# Patient Record
Sex: Female | Born: 1945 | Race: White | Hispanic: No | Marital: Married | Smoking: Former smoker
Health system: Southern US, Community
[De-identification: ages and names within clinical notes are randomized; demographics above are authoritative.]

## PROBLEM LIST (undated history)

## (undated) DIAGNOSIS — F329 Major depressive disorder, single episode, unspecified: Secondary | ICD-10-CM

## (undated) DIAGNOSIS — K219 Gastro-esophageal reflux disease without esophagitis: Secondary | ICD-10-CM

## (undated) DIAGNOSIS — I10 Essential (primary) hypertension: Secondary | ICD-10-CM

## (undated) DIAGNOSIS — N390 Urinary tract infection, site not specified: Secondary | ICD-10-CM

## (undated) DIAGNOSIS — R011 Cardiac murmur, unspecified: Secondary | ICD-10-CM

## (undated) DIAGNOSIS — J40 Bronchitis, not specified as acute or chronic: Secondary | ICD-10-CM

## (undated) DIAGNOSIS — R51 Headache: Secondary | ICD-10-CM

## (undated) DIAGNOSIS — R0602 Shortness of breath: Secondary | ICD-10-CM

## (undated) DIAGNOSIS — K743 Primary biliary cirrhosis: Secondary | ICD-10-CM

## (undated) DIAGNOSIS — K439 Ventral hernia without obstruction or gangrene: Secondary | ICD-10-CM

## (undated) DIAGNOSIS — M1990 Unspecified osteoarthritis, unspecified site: Secondary | ICD-10-CM

## (undated) DIAGNOSIS — R32 Unspecified urinary incontinence: Secondary | ICD-10-CM

## (undated) DIAGNOSIS — C50919 Malignant neoplasm of unspecified site of unspecified female breast: Secondary | ICD-10-CM

## (undated) DIAGNOSIS — F419 Anxiety disorder, unspecified: Secondary | ICD-10-CM

## (undated) DIAGNOSIS — R161 Splenomegaly, not elsewhere classified: Secondary | ICD-10-CM

## (undated) DIAGNOSIS — F32A Depression, unspecified: Secondary | ICD-10-CM

## (undated) DIAGNOSIS — G629 Polyneuropathy, unspecified: Secondary | ICD-10-CM

## (undated) DIAGNOSIS — D649 Anemia, unspecified: Secondary | ICD-10-CM

## (undated) DIAGNOSIS — Z9851 Tubal ligation status: Secondary | ICD-10-CM

## (undated) DIAGNOSIS — K649 Unspecified hemorrhoids: Secondary | ICD-10-CM

## (undated) HISTORY — DX: Polyneuropathy, unspecified: G62.9

## (undated) HISTORY — DX: Anxiety disorder, unspecified: F41.9

## (undated) HISTORY — DX: Tubal ligation status: Z98.51

## (undated) HISTORY — DX: Major depressive disorder, single episode, unspecified: F32.9

## (undated) HISTORY — PX: BREAST SURGERY: SHX581

## (undated) HISTORY — PX: COLONOSCOPY: SHX174

## (undated) HISTORY — PX: ABDOMINAL HYSTERECTOMY: SUR658

## (undated) HISTORY — DX: Malignant neoplasm of unspecified site of unspecified female breast: C50.919

## (undated) HISTORY — PX: EXCISIONAL HEMORRHOIDECTOMY: SHX1541

## (undated) HISTORY — PX: UPPER GASTROINTESTINAL ENDOSCOPY: SHX188

## (undated) HISTORY — PX: ABDOMINAL HYSTERECTOMY: SHX81

## (undated) HISTORY — PX: MASTECTOMY: SHX3

## (undated) HISTORY — DX: Primary biliary cirrhosis: K74.3

## (undated) HISTORY — DX: Depression, unspecified: F32.A

## (undated) HISTORY — PX: TUBAL LIGATION: SHX77

---

## 2003-04-05 ENCOUNTER — Encounter (INDEPENDENT_AMBULATORY_CARE_PROVIDER_SITE_OTHER): Payer: Self-pay | Admitting: *Deleted

## 2003-04-05 ENCOUNTER — Ambulatory Visit (HOSPITAL_COMMUNITY): Admission: RE | Admit: 2003-04-05 | Discharge: 2003-04-05 | Payer: Self-pay | Admitting: Oncology

## 2003-07-02 ENCOUNTER — Ambulatory Visit (HOSPITAL_COMMUNITY): Admission: RE | Admit: 2003-07-02 | Discharge: 2003-07-02 | Payer: Self-pay | Admitting: Internal Medicine

## 2004-05-15 ENCOUNTER — Ambulatory Visit: Payer: Self-pay | Admitting: Internal Medicine

## 2004-05-29 ENCOUNTER — Ambulatory Visit: Payer: Self-pay | Admitting: Internal Medicine

## 2005-01-08 ENCOUNTER — Ambulatory Visit: Payer: Self-pay | Admitting: Internal Medicine

## 2005-08-18 ENCOUNTER — Ambulatory Visit: Payer: Self-pay | Admitting: Internal Medicine

## 2005-08-19 ENCOUNTER — Ambulatory Visit (HOSPITAL_COMMUNITY): Admission: RE | Admit: 2005-08-19 | Discharge: 2005-08-19 | Payer: Self-pay | Admitting: Internal Medicine

## 2006-02-10 ENCOUNTER — Ambulatory Visit: Payer: Self-pay | Admitting: Internal Medicine

## 2006-09-02 ENCOUNTER — Ambulatory Visit: Payer: Self-pay | Admitting: Internal Medicine

## 2009-04-19 ENCOUNTER — Ambulatory Visit (HOSPITAL_COMMUNITY): Admission: RE | Admit: 2009-04-19 | Discharge: 2009-04-19 | Payer: Self-pay | Admitting: Internal Medicine

## 2010-02-11 ENCOUNTER — Ambulatory Visit: Payer: Self-pay | Admitting: Internal Medicine

## 2010-02-12 ENCOUNTER — Inpatient Hospital Stay (HOSPITAL_COMMUNITY): Admission: AD | Admit: 2010-02-12 | Discharge: 2010-02-14 | Payer: Self-pay | Admitting: Internal Medicine

## 2010-02-12 ENCOUNTER — Ambulatory Visit: Payer: Self-pay | Admitting: Internal Medicine

## 2010-02-12 ENCOUNTER — Ambulatory Visit: Payer: Self-pay | Admitting: Cardiology

## 2010-02-14 ENCOUNTER — Encounter (INDEPENDENT_AMBULATORY_CARE_PROVIDER_SITE_OTHER): Payer: Self-pay | Admitting: Internal Medicine

## 2010-02-17 ENCOUNTER — Ambulatory Visit (HOSPITAL_COMMUNITY): Admission: RE | Admit: 2010-02-17 | Payer: Self-pay | Admitting: Internal Medicine

## 2010-03-03 ENCOUNTER — Ambulatory Visit: Payer: Self-pay | Admitting: Internal Medicine

## 2010-03-29 ENCOUNTER — Inpatient Hospital Stay (HOSPITAL_COMMUNITY)
Admission: AD | Admit: 2010-03-29 | Discharge: 2010-03-30 | Payer: Self-pay | Source: Home / Self Care | Attending: Internal Medicine | Admitting: Internal Medicine

## 2010-04-19 ENCOUNTER — Encounter (INDEPENDENT_AMBULATORY_CARE_PROVIDER_SITE_OTHER): Payer: Self-pay | Admitting: Internal Medicine

## 2010-04-21 ENCOUNTER — Ambulatory Visit: Admit: 2010-04-21 | Payer: Self-pay | Admitting: Internal Medicine

## 2010-05-12 ENCOUNTER — Ambulatory Visit (INDEPENDENT_AMBULATORY_CARE_PROVIDER_SITE_OTHER): Payer: Self-pay | Admitting: Internal Medicine

## 2010-05-13 ENCOUNTER — Ambulatory Visit (INDEPENDENT_AMBULATORY_CARE_PROVIDER_SITE_OTHER): Payer: Medicare Other | Admitting: Internal Medicine

## 2010-05-13 ENCOUNTER — Ambulatory Visit (INDEPENDENT_AMBULATORY_CARE_PROVIDER_SITE_OTHER): Payer: Self-pay | Admitting: Internal Medicine

## 2010-05-20 NOTE — Discharge Summary (Signed)
Ann Ward, BLAUVELT              ACCOUNT NO.:  [id number]  MEDICAL RECORD NO.:  [id number]          PATIENT TYPE:  INP  LOCATION:  A317                          FACILITY:  [facility]  PHYSICIAN:  Lionel December, M.D.    DATE OF BIRTH:  Jan 22, 1946  DATE OF ADMISSION:  03/29/2010 DATE OF DISCHARGE:  01/01/2012LH                              DISCHARGE SUMMARY   DISCHARGE DIAGNOSES: 1. Anemia secondary to acute gastrointestinal bleed. 2. Gastric ulcers in the prepyloric region. 3. Nonbleeding 4 columns of esophageal varices. 4. Known primary biliary cirrhosis. 5. Type 2 diabetes mellitus. 6. Chronic gastroesophageal reflux disease. 7. Bipolar disorder with history of anxiety and depression. 8. Peripheral neuropathy secondary to diabetes mellitus. 9. Multi-nodular goiter diagnosed during recent hospitalization. 10.Leukopenia and thrombocytopenia secondary to cirrhosis.  PRINCIPAL PROCEDURE:  Esophagogastroduodenoscopy on March 30, 2010 by Dr. Karilyn Cota.  CONDITION AT THE TIME OF DISCHARGE:  Much improved.  Discharge hemoglobin 9.5 and hematocrit 29.9.  DISCHARGE MEDICATIONS: 1. Neurontin 400 mg four times a day. 2. Lorazepam 1 mg four times a day. 3. Urso 300 mg three times a day. 4. Prozac 40 mg p.o. q.a.m. 5. Metformin 500 mg in a.m. and 500 mg in p.m. as-needed basis. 6. Os-Cal with vitamin D b.i.d. 7. Omeprazole 20 mg every morning. 8. Ferrous sulfate 325 mg once or twice daily. 9. Furosemide 20 mg daily p.r.n. 10.KCL 20 mEq on days she take Lasix. 11.B complex one daily. 12.Geritol one daily. 13.Vitamin C one daily.  HOSPITAL COURSE:  Ann Ward is a 65 year old Caucasian female with history of PVC, which was diagnosed in January 2005 and at that time was stage II but now suspected to be stage III or IV.  She was recently hospitalized with anemia which was determined to be due to iron deficiency anemia, however, there was no evidence of active bleed.  She was given transfusion  and she had colonoscopy.  At this time, she presented with melena and anemia.  She had a hemoglobin of 6 g and hematocrit of 20.4.  Her MCV is surprising was normal 86.4, platelet count was 120 K.  Her chemistry panel was unremarkable except serum albumin of 2.7.  The patient was noted to have tarry stools with heme- positive.  She turns out had been taking too many Advil lately for headache.  The patient was typed and crossmatched.  She was given 2 units followed by two more units bring her hemoglobin to 9.5 g.  She underwent esophagogastroduodenoscopy.  She had 4 short columns of grade I esophageal varices without stigmata of bleeding.  She had two small telangiectasia at antrum felt not to be the source of bleeding.  She had a 4-mm antral ulcer and another small ulcer at the pyloric channel.  She did not have fundal varices or bulbar ulcer.  These ulcers were felt to be most likely source of her blood loss.  I felt she could also have mucosal injury in her small intestine secondary to chronic NSAID therapy.  She felt so much better after she received 4 units of PRBCs. She had iron studies which confirmed iron deficiency anemia.  Serum  iron was less than 10, saturation could not be determined but her ferritin was 8.  Her INR was 1.39.  Her H. pylori was requested, but it was not done.  This would be done on an outpatient basis.  The patient was discharged to home in much improved condition.  She will have her lab studies repeated and her hemoglobin repeated in 2 weeks.  If her H and H does not come up with iron therapy, she will need a given capsule study.  The patient was advised not to take any OTC NSAIDs.     Lionel December, M.D.     NR/MEDQ  D:  05/18/2010  T:  05/18/2010  Job:  [id number]  cc:   Dr. Sherryll Burger  Electronically Signed by Lionel December M.D. on 05/20/2010 02:09:23 PM

## 2010-06-09 LAB — CBC
Hemoglobin: 5.3 g/dL — CL (ref 12.0–15.0)
Hemoglobin: 9.5 g/dL — ABNORMAL LOW (ref 12.0–15.0)
MCH: 25.1 pg — ABNORMAL LOW (ref 26.0–34.0)
MCH: 27.3 pg (ref 26.0–34.0)
MCHC: 28.6 g/dL — ABNORMAL LOW (ref 30.0–36.0)
MCHC: 31.8 g/dL (ref 30.0–36.0)
MCV: 85.9 fL (ref 78.0–100.0)
MCV: 87.7 fL (ref 78.0–100.0)
RBC: 3.48 MIL/uL — ABNORMAL LOW (ref 3.87–5.11)

## 2010-06-09 LAB — DIFFERENTIAL
Basophils Relative: 1 % (ref 0–1)
Basophils Relative: 1 % (ref 0–1)
Eosinophils Absolute: 0 10*3/uL (ref 0.0–0.7)
Eosinophils Absolute: 0.1 10*3/uL (ref 0.0–0.7)
Eosinophils Relative: 2 % (ref 0–5)
Eosinophils Relative: 3 % (ref 0–5)
Lymphs Abs: 0.4 10*3/uL — ABNORMAL LOW (ref 0.7–4.0)
Lymphs Abs: 0.6 10*3/uL — ABNORMAL LOW (ref 0.7–4.0)
Monocytes Absolute: 0.2 10*3/uL (ref 0.1–1.0)
Monocytes Absolute: 0.2 10*3/uL (ref 0.1–1.0)
Monocytes Relative: 10 % (ref 3–12)
Neutro Abs: 1.6 10*3/uL — ABNORMAL LOW (ref 1.7–7.7)
Neutrophils Relative %: 73 % (ref 43–77)

## 2010-06-09 LAB — GLUCOSE, CAPILLARY
Glucose-Capillary: 101 mg/dL — ABNORMAL HIGH (ref 70–99)
Glucose-Capillary: 111 mg/dL — ABNORMAL HIGH (ref 70–99)
Glucose-Capillary: 79 mg/dL (ref 70–99)
Glucose-Capillary: 88 mg/dL (ref 70–99)

## 2010-06-09 LAB — CROSSMATCH
ABO/RH(D): B POS
Unit division: 0
Unit division: 0

## 2010-06-09 LAB — IRON AND TIBC

## 2010-06-09 LAB — BASIC METABOLIC PANEL
CO2: 29 mEq/L (ref 19–32)
Chloride: 101 mEq/L (ref 96–112)
Creatinine, Ser: 1.14 mg/dL (ref 0.4–1.2)
GFR calc Af Amer: 58 mL/min — ABNORMAL LOW (ref >60)
Glucose, Bld: 79 mg/dL (ref 70–99)
Sodium: 136 mEq/L (ref 135–145)

## 2010-06-09 LAB — HEMOGLOBIN AND HEMATOCRIT, BLOOD: HCT: 24.9 % — ABNORMAL LOW (ref 36.0–46.0)

## 2010-06-09 LAB — PREPARE RBC (CROSSMATCH)

## 2010-06-10 LAB — BASIC METABOLIC PANEL
BUN: 11 mg/dL (ref 6–23)
BUN: 7 mg/dL (ref 6–23)
CO2: 31 mEq/L (ref 19–32)
CO2: 33 mEq/L — ABNORMAL HIGH (ref 19–32)
Calcium: 9 mg/dL (ref 8.4–10.5)
Calcium: 9.1 mg/dL (ref 8.4–10.5)
Chloride: 100 mEq/L (ref 96–112)
Chloride: 98 mEq/L (ref 96–112)
Creatinine, Ser: 0.85 mg/dL (ref 0.4–1.2)
Creatinine, Ser: 0.86 mg/dL (ref 0.4–1.2)
GFR calc Af Amer: >60 mL/min (ref >60)
Glucose, Bld: 106 mg/dL — ABNORMAL HIGH (ref 70–99)

## 2010-06-10 LAB — GLUCOSE, CAPILLARY
Glucose-Capillary: 109 mg/dL — ABNORMAL HIGH (ref 70–99)
Glucose-Capillary: 122 mg/dL — ABNORMAL HIGH (ref 70–99)
Glucose-Capillary: 146 mg/dL — ABNORMAL HIGH (ref 70–99)
Glucose-Capillary: 152 mg/dL — ABNORMAL HIGH (ref 70–99)
Glucose-Capillary: 172 mg/dL — ABNORMAL HIGH (ref 70–99)

## 2010-06-10 LAB — CROSSMATCH
Unit division: 0
Unit division: 0

## 2010-06-10 LAB — CBC
MCH: 23.6 pg — ABNORMAL LOW (ref 26.0–34.0)
MCHC: 32.3 g/dL (ref 30.0–36.0)
MCV: 69.2 fL — ABNORMAL LOW (ref 78.0–100.0)
MCV: 73 fL — ABNORMAL LOW (ref 78.0–100.0)
Platelets: 84 10*3/uL — ABNORMAL LOW (ref 150–400)
Platelets: 87 10*3/uL — ABNORMAL LOW (ref 150–400)
RBC: 2.84 MIL/uL — ABNORMAL LOW (ref 3.87–5.11)
WBC: 2.9 10*3/uL — ABNORMAL LOW (ref 4.0–10.5)

## 2010-06-10 LAB — DIFFERENTIAL
Basophils Absolute: 0 10*3/uL (ref 0.0–0.1)
Basophils Relative: 0 % (ref 0–1)
Eosinophils Absolute: 0 10*3/uL (ref 0.0–0.7)
Eosinophils Relative: 1 % (ref 0–5)
Eosinophils Relative: 1 % (ref 0–5)
Lymphocytes Relative: 15 % (ref 12–46)
Lymphs Abs: 0.4 10*3/uL — ABNORMAL LOW (ref 0.7–4.0)
Neutro Abs: 2.1 10*3/uL (ref 1.7–7.7)
Neutrophils Relative %: 65 % (ref 43–77)
Neutrophils Relative %: 73 % (ref 43–77)

## 2010-06-10 LAB — HEMOGLOBIN AND HEMATOCRIT, BLOOD
HCT: 26.8 % — ABNORMAL LOW (ref 36.0–46.0)
HCT: 29.4 % — ABNORMAL LOW (ref 36.0–46.0)
Hemoglobin: 8.6 g/dL — ABNORMAL LOW (ref 12.0–15.0)
Hemoglobin: 9.5 g/dL — ABNORMAL LOW (ref 12.0–15.0)

## 2010-06-10 LAB — VITAMIN B12: Vitamin B-12: 436 pg/mL (ref 211–911)

## 2010-06-10 LAB — TSH: TSH: 1.192 u[IU]/mL (ref 0.350–4.500)

## 2010-06-10 LAB — FOLATE RBC: RBC Folate: 1873 ng/mL — ABNORMAL HIGH (ref 180–600)

## 2010-06-10 LAB — MRSA PCR SCREENING: MRSA by PCR: POSITIVE — AB

## 2010-06-10 LAB — IRON AND TIBC: UIBC: 362 ug/dL

## 2010-08-12 NOTE — Assessment & Plan Note (Signed)
Ann Ward, Ann Ward               CHART#:  [id number]   DATE:  09/02/2006                       DOB:  08-Jul-1945   PRESENTING COMPLAINT:  Followup for cholestatic liver disease.   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  She has stage II disease.   She had EGD in April 2005 which was negative for varices.   SUBJECTIVE:  Patient is a 65 year old Caucasian female who is here for a  6 month visit.  She has no complaints.  She is very upset that she lost  a paternal uncle who was like a dad to her.  She has gained another 5  pounds.  She states she has been unable to do much walking or exercise.  She states she has a very good appetite.  She denies abdominal pain,  pruritus or weakness.  She denies melena or rectal bleeding.   She is on Prozac 40 mg q a.m., lorazepam 2 mg t.i.d. p.r.n., MVI daily,  hydrochlorothiazide 25 mg daily, Caltrate with D 3 tablets a day, fiber  tablet 1 daily, diphenhydramine 50 mg nightly, glimepiride 2 mg b.i.d.,  metformin 500 mg b.i.d., Lomotil p.r.n. which she uses occasionally,  Urso 300 mg t.i.d., Zyrtec 10 mg daily.   OBJECTIVE:  VITAL SIGNS: Weight 175.5 pounds.  She is 5 feet 4 inches  tall.  Pulse 88 per minute.  Blood pressure 120/70.  Temperature is  98.4.  HEENT: Conjunctiva is pink.  Sclerae is nonicteric.  Oropharyngeal  mucosa is normal.  No neck masses.  She does not have spider angiomata.  ABDOMEN: Is obese.  Soft.  She has hepatomegaly with very prominent left  lobe.  Spleen tip is not palpable.  Her liver edges 6 centimeters below  RCM at mid clavicle line.  She does not have shifting dullness or  peripheral edema.  She also does not have asterixis.   ASSESSMENT:  Patient has stage II AMA negative primary biliary cirrhosis  diagnosed in January of 2005.  While her LFTs have been normal I am very  concerned transaminases have remained normal.  I am very concerned about  progression of her disease with  weight gain because I feel she also has  nonalcoholic steatohepatitis.  She has had mild thrombocytopenia.  She  is due for repeat laboratory studies.   PLAN:  Once again patient advised to cut back on calorie intake and she  needs to start exercise and walking and gradually increase physical  activity.  It would also help with her glycemic control.   She will have a CBC and LFTs.  If her laboratory data is normal or  stable we will plan to see her back in  6 months.       Lionel December, M.D.  Electronically Signed     NR/MEDQ  D:  09/02/2006  T:  09/03/2006  Job:  [id number]   cc:   Kirstie Peri, MD

## 2010-08-15 NOTE — Op Note (Signed)
NAME:  NAIDELIN, GUGLIOTTA NO.:  [id number]   MEDICAL RECORD NO.:  [id number]                  PATIENT TYPE:   LOCATION:                                       FACILITY:   PHYSICIAN:  Lionel December, M.D.                 DATE OF BIRTH:   DATE OF PROCEDURE:  07/02/2003  DATE OF DISCHARGE:                                 OPERATIVE REPORT   PROCEDURE:  Esophagogastroduodenoscopy.   ENDOSCOPIST:  Lionel December, M.D.   INDICATIONS:  This patient is a 65 year old Caucasian female who was  recently diagnosed with PBC based on liver histology.  Her AMA is negative.  Her ACE level is mildly elevated to 72, felt to be nonspecific.  She has  typical liver histology which I have reviewed with Dr. Mark Swaziland.  She is  undergoing EGD looking for esophageal and/or gastric varices.   The procedure and risks were reviewed with the patient and informed consent  was obtained.   PREOPERATIVE MEDICATIONS:  Cetacaine spray for oropharyngeal topical  anesthesia, Demerol 50 mg IV and Versed 5 mg IV in divided dose.   FINDINGS:  Procedure performed in endoscopy suite.  The patient's vital  signs and O2 saturation were monitored during the procedure and remained  stable.  The patient was placed in the left lateral recumbent position and  Olympus videoscope was passed via the oropharynx without any difficulty into  the esophagus.   ESOPHAGUS:  Mucosa of the esophagus was normal throughout.  There were no  varices or other abnormalities noted.  There was a 3-cm, size, sliding,  hiatal hernia.   STOMACH:  It was empty and distended very well with insufflation.  The folds  of the proximal stomach were normal.  Examination of the mucosa at body,  antrum, pyloric channel, as well as angularis, fundus and cardiac was  normal.  No changes were noted to suggest portal gastropathy and or varices.   DUODENUM:  Examination of the bulb reveals normal mucosa.  The scope was  passed to  the second part of the duodenum where mucosa and folds were  normal.   Endoscope was withdrawn. The patient tolerated the procedure well.   FINAL DIAGNOSES:  1. Small sliding hiatal hernia, otherwise normal esophagogastroduodenoscopy.  2. No evidence of esophageal/gastric varices or portal gastropathy.   RECOMMENDATIONS:  She will continue __________ at 500 mg p.o. b.i.d. and she  will return for OV 3 months from now.      ___________________________________________                                            Lionel December, M.D.   NR/MEDQ  D:  07/02/2003  T:  07/02/2003  Job:  [id number]   cc:   Genene Churn. Cyndie Chime, M.D.  [address] [facility]  [place]  [place] [address]  Fax: [phone]   Brigitte Pulse, M.D.  [facility]  Department of Radiation Oncology  [place]  [place]

## 2010-08-15 NOTE — Consult Note (Signed)
[name], [name]                          ACCOUNT NO.:  [id number]   MEDICAL RECORD NO.:  [id number]                   PATIENT TYPE:  OUT   LOCATION:  OMED                                 FACILITY:  [facility]   PHYSICIAN:  Ann Ward, M.D.                 DATE OF BIRTH:  04-05-1945   DATE OF CONSULTATION:  DATE OF DISCHARGE:                                   CONSULTATION   REASON FOR CONSULTATION:  For further evaluation and treatment of recently  diagnosed cirrhosis.   HISTORY OF PRESENT ILLNESS:  Ann Ward is a 65 year old Caucasian female who  was referred for courtesy of Dr. Cephas Ward for further evaluation of  biopsy proven services.  The patient has had mild leukopenia,  thrombocytopenia and anemia since September 2003.  She recently presented  with abdominal distention.  She had ultrasound which showed  hepatosplenomegaly without any focal abnormalities.  This was performed on  Ward 15, 2005.  It was negative for ascites.  She had abdominal pelvic  CT on April 16, 2003, which once again showed hepatosplenomegaly.  With  her previous history of breast carcinoma, there was a concern for metastatic  disease, but there was no evidence of it.  Ultrasound did suggest increased  acrogenicity.  She had negative ANA, hepatitis B surface antigen.  Hepatitis  C antibody was also negative.  Ceruloplasmin was 46.6 which was slightly  elevated.  Her LFT's on Ward 7, 2004, were bilirubin of 1.1, AP of 102.  AST 37, ALT 25, LDL 111.  Albumin was 3.9, total protein 8.8.  WBC was 2.6,  H&H 12.6 and 36.3, platelet 120,000.  She had liver biopsy under ultrasound  guidance on April 26, 2003.  Her INR prior to that was 1.4.  Liver biopsy  showed prominent lymphocytic infiltrate in portal areas, __________  lymphocytic infiltrate also observed.  The smaller ducts and protal areas  were extensively surrounded by in many cases infiltrated by lymphocytes with  necrosis of  epithelial cells.  The larger ducts were unaffected.  Focally,  there were glomerular changes.  Trichrome stain showed increased fibrosis in  and around the portal tracts.  Iron stain was negative.  These changes are  felt to be consistent with PBC.  No unusual pigmentation was noted.   There is no prior history of jaundice or hepatitis.  The patient does  complain of itching which she has had for the last couple of months.  She  complains of being bloated.  Her appetite has been up and down.  She has had  nausea with occasional vomiting.  She also has irregular bowel movements.  She either has constipation and/or diarrhea.  She denies melena or rectal  bleeding.  She has heartburn at a frequency of greater than 3 times for  which she is using Zantac OTC.  She does not believe that she has lost any  weight  this year.  She tells me that she had a bone density last year which  was normal according to the patient.   PAST MEDICAL HISTORY:  1. History of depression of many years duration.  2. Labile hypertension.  She is on HCTZ for fluid retention.  3. History of stage II breast carcinoma diagnosed in August 2000.  4. Left mastectomy.  Maintained on Tamoxifen for 2-1/2 years.  She also was     on Femara but this has been discontinued.  5. Blind in her left eye.   PAST SURGICAL HISTORY:  1. Appendectomy.  2. Hemorrhoidectomy.  3. Tonsillectomy.  4. Adenoidectomy.  5. Tubal ligation.   MEDICATIONS:  1. Prozac 40 mg daily.  2. Lorazepam 2 mg p.r.n.  3. MVI daily.  4. Tylenol p.r.n.  5. HCTZ 25 mg daily.  6. Zantac OTC usually two at a time.   ALLERGIES:  No known drug allergies.   FAMILY HISTORY:  Mother died of diabetic complications at age 48 and father  of lung disease at age 68.  She lost one sister of polio in her 52's.  She  apparently had heart problems.  One brother died of accidental gunshot at  age 56 or 54.  She has one sister and brother in good health.   SOCIAL  HISTORY:  She is married.  She has three healthy children, except her  two sons are overweight.  She works at [employer] for over 11  years.  She is now on disability.  She smoked about a pack a day for 25  years.  She used to drink alcohol every now and then, but has not had any in  the last 20 years.   PHYSICAL EXAMINATION:  GENERAL APPEARANCE:  Pleasant, well-developed, well-  nourished Caucasian female.  She appears somewhat sallow, but this is felt  to be due to her makeup.  VITAL SIGNS:  She weighs 161 pounds, 5 feet 4 inches tall.  Pulse 74, blood  pressure 110/70, temperature 99.  HEENT:  Conjunctivae is pink.  Sclerae is nonicteric.  Oropharyngeal mucosa  is normal.  NECK:  Without masses or thyromegaly.  HEART:  Regular rhythm.  Normal S1, S3.  No murmur or gallop noted.  LUNGS:  Clear to auscultation.  She does not have any spider angioedema.  ABDOMEN:  Protuberant, particularly in the upper half.  Bowel sounds are  normal.  Palpation revealed soft abdomen.  She has a large liver.  Clear  margins about 7 cm below RCM at end inspiration.  Span is 6-7 cm.  Spleen is  not palpable.  She does not have peripheral edema.  RECTAL:  Deferred.  EXTREMITIES:  No clubbing.  No asterixis.   LABORATORY DATA:  As above.   IMPRESSION:  Ann Ward is a 65 year old Caucasian female who has had mild  leukopenia, anemia and thrombocytopenia for over a year and now documented  to have hepatosplenomegaly without any focal abnormalities.  __________  markers for BNC were negative.  ANA was also negative.  Ceruloplasmin is  slightly elevated which will be nonspecific.  One would expect low levels  with Wilson's disease.  She has normal serum albumin, very mildly elevated  alkaline phosphatase with normal AST and ALT.  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  Sarcoidosis would also be in the  differential  diagnosis.  I doubt that her liver condition is  due to prior chemotherapy.   She appears to have compensated disease and I feel her lung prognosis of  favorable.   RECOMMENDATIONS:  1. She will go to an [facility] lab for repeat LFTs, antimitochondrial antibody and     ACE levels.  We get a copy of bone density study for our review.  I will     review her liver biopsy slice later today with Dr. Swaziland.  2. Will start her on Urso-500 1 b.i.d.  3. The patient is also advised to take calcium with vitamin D 1.2 g per day.  4. She will also need esophagogastroduodenoscopy looking for     esophageal/gastric varices, but this will be delayed until lab studies     are available for review.  5. I also recommended high fiber diet and Metamucil or Citrucel tablets as I     feel she also has irritable bowel syndrome.  Colonoscopy for screening     purposes will be offered at a later date.   I would like to thank Dr. Cyndie Chime for the opportunity to participate in  the care of this nice lady.      ___________________________________________                                            Ann Ward, M.D.   NR/MEDQ  D:  06/01/2003  T:  06/01/2003  Job:  [id number]   cc:   Genene Churn. Cyndie Chime, M.D.  [address] [facility]  [place]  [place] [address]  Fax: [phone]   Loralyn Freshwater  [facility]  Dept of Radiation [facility] [place], [place] [address]  Fax: [phone]

## 2011-01-09 ENCOUNTER — Other Ambulatory Visit (INDEPENDENT_AMBULATORY_CARE_PROVIDER_SITE_OTHER): Payer: Self-pay | Admitting: Internal Medicine

## 2011-01-09 DIAGNOSIS — K743 Primary biliary cirrhosis: Secondary | ICD-10-CM

## 2011-01-13 ENCOUNTER — Encounter (INDEPENDENT_AMBULATORY_CARE_PROVIDER_SITE_OTHER): Payer: Self-pay | Admitting: Internal Medicine

## 2011-01-13 ENCOUNTER — Ambulatory Visit (INDEPENDENT_AMBULATORY_CARE_PROVIDER_SITE_OTHER): Payer: Medicare Other | Admitting: Internal Medicine

## 2011-01-13 DIAGNOSIS — K745 Biliary cirrhosis, unspecified: Secondary | ICD-10-CM

## 2011-01-13 DIAGNOSIS — K743 Primary biliary cirrhosis: Secondary | ICD-10-CM

## 2011-01-13 DIAGNOSIS — E119 Type 2 diabetes mellitus without complications: Secondary | ICD-10-CM

## 2011-01-13 DIAGNOSIS — K219 Gastro-esophageal reflux disease without esophagitis: Secondary | ICD-10-CM

## 2011-01-13 DIAGNOSIS — D649 Anemia, unspecified: Secondary | ICD-10-CM

## 2011-01-13 MED ORDER — LANSOPRAZOLE 30 MG PO CPDR: 30.0000 mg | DELAYED_RELEASE_CAPSULE | Freq: Every day | ORAL | Status: DC

## 2011-01-13 NOTE — Patient Instructions (Signed)
Reminder. Need to lose 10 lb before your next visit. Physician will call with blood test results.

## 2011-01-14 LAB — CBC
HCT: 36.6 % (ref 36.0–46.0)
MCH: 28.8 pg (ref 26.0–34.0)
MCHC: 33.3 g/dL (ref 30.0–36.0)
MCV: 86.3 fL (ref 78.0–100.0)
Platelets: 67 10*3/uL — ABNORMAL LOW (ref 150–400)
RDW: 16.5 % — ABNORMAL HIGH (ref 11.5–15.5)
WBC: 3.7 10*3/uL — ABNORMAL LOW (ref 4.0–10.5)

## 2011-01-14 LAB — COMPREHENSIVE METABOLIC PANEL
ALT: 14 U/L (ref 0–35)
AST: 22 U/L (ref 0–37)
Alkaline Phosphatase: 83 U/L (ref 39–117)
Creat: 0.83 mg/dL (ref 0.50–1.10)
Sodium: 130 mEq/L — ABNORMAL LOW (ref 135–145)
Total Bilirubin: 1 mg/dL (ref 0.3–1.2)
Total Protein: 7.8 g/dL (ref 6.0–8.3)

## 2011-01-16 NOTE — Progress Notes (Signed)
Presenting complaint; followup for iron deficiency anemia and PBC. Subjective; patient is 65 year old Caucasian female who is here for scheduled visit accompanied by her daughter [name]. Her last visit was in February this year. She had surgery at [facility] in April a complex right ovarian mass. It appears she had a BSO with hysterectomy. She was transferred because of underlying cirrhosis. He did not have any postop complications.  She has no complaints. Her appetite is good. She has gained 14 pounds since her last visit. She is not having any difficulty with sleep her daughter states her memory is reasonably good to patient states that she is helping with her husband was severe COPD and arthritis. She does do the usual housework. She is having 4-6 stools per day but denies melena or rectal bleeding or accidents. She states she is a lot of snacks. Current Outpatient Prescriptions  Medication Sig Dispense Refill  . amitriptyline (ELAVIL) 10 MG tablet Take 10 mg by mouth at bedtime.        [name] b complex vitamins capsule Take 1 capsule by mouth daily.        [name] CALCIUM CARB-CHOLECALCIFEROL PO Take by mouth 2 (two) times daily.        . Cholecalciferol (VITAMIN D-3 PO) Take by mouth daily.        . ferrous sulfate 325 (65 FE) MG tablet Take 325 mg by mouth 2 (two) times daily.        . fexofenadine (ALLEGRA) 180 MG tablet Take 180 mg by mouth daily.        [name] FLUoxetine (PROZAC) 20 MG tablet Take 20 mg by mouth 2 (two) times daily.        . furosemide (LASIX) 20 MG tablet Take 20 mg by mouth. As needed       . gabapentin (NEURONTIN) 300 MG capsule Take 300 mg by mouth 4 (four) times daily.        . hydrochlorothiazide (HYDRODIURIL) 25 MG tablet Take 25 mg by mouth daily.        . Iron-Vitamins (GERITOL PO) Take by mouth daily.        [name] LORazepam (ATIVAN) 1 MG tablet Take 1 mg by mouth. Takes 1 by mouth 4 times a day       . POTASSIUM CHLORIDE PO Take 20 mEq by mouth. Takes 1 by mouth  only when she takes the Lasix       . Probiotic Product (ALIGN) 4 MG CAPS Take by mouth daily.        . ursodiol (ACTIGALL) 300 MG capsule take 1 capsule by mouth three times a day  270 capsule  3  . vitamin C (ASCORBIC ACID) 500 MG tablet Take 500 mg by mouth daily.        . Zinc 50 MG TABS Take by mouth daily.        . lansoprazole (PREVACID) 30 MG capsule Take 1 capsule (30 mg total) by mouth daily.  30 capsule  11   Objective BP 110/78  Pulse 80  Temp(Src) 98.6 F (37 C) (Oral)  Resp 18  Ht 5\' 4"  (1.626 m)  Wt 169 lb (76.658 kg)  BMI 29.01 kg/m2 Patient is alert and without asterixis Conjunctiva is pink and sclerae is nonicteric. Oropharyngeal mucosa is normal. No neck masses or thyromegaly noted Cardiac exam with regular rhythm normal S1 and S2. Lungs are clear to auscultation Abdomen is is full with splenomegaly and hepatomegaly. No shifting dullness noted.  No peripheral edema or clubbing noted. Assessment #1. PBC. This condition was diagnosed in 2005. She has well preserved hepatic function. Given history of diabetes mellitus and weight gain on concerned that this could accelerate her liver disease. Therefore she needs to control her calorie intake and try to lose some weight #2. She of iron deficiency anemia. He is due for CBC. #3. Chronic GERD well controlled with therapy. Plan She'll have CBC, comprehensive chemistry panel and hemoglobin A1c. Lab studies will be reviewed with patient's daughter and the plan to see her back in 6 months.

## 2011-05-05 ENCOUNTER — Encounter (INDEPENDENT_AMBULATORY_CARE_PROVIDER_SITE_OTHER): Payer: Self-pay | Admitting: Internal Medicine

## 2011-05-05 ENCOUNTER — Ambulatory Visit (INDEPENDENT_AMBULATORY_CARE_PROVIDER_SITE_OTHER): Payer: Medicare Other | Admitting: Internal Medicine

## 2011-05-05 VITALS — BP 140/74 | HR 78 | Temp 98.9°F | Resp 16 | Ht 64.0 in | Wt 170.0 lb

## 2011-05-05 DIAGNOSIS — G629 Polyneuropathy, unspecified: Secondary | ICD-10-CM | POA: Insufficient documentation

## 2011-05-05 DIAGNOSIS — D509 Iron deficiency anemia, unspecified: Secondary | ICD-10-CM

## 2011-05-05 DIAGNOSIS — F410 Panic disorder [episodic paroxysmal anxiety] without agoraphobia: Secondary | ICD-10-CM | POA: Insufficient documentation

## 2011-05-05 DIAGNOSIS — F329 Major depressive disorder, single episode, unspecified: Secondary | ICD-10-CM | POA: Insufficient documentation

## 2011-05-05 DIAGNOSIS — K743 Primary biliary cirrhosis: Secondary | ICD-10-CM | POA: Insufficient documentation

## 2011-05-05 DIAGNOSIS — K5900 Constipation, unspecified: Secondary | ICD-10-CM

## 2011-05-05 DIAGNOSIS — K745 Biliary cirrhosis, unspecified: Secondary | ICD-10-CM

## 2011-05-05 DIAGNOSIS — K439 Ventral hernia without obstruction or gangrene: Secondary | ICD-10-CM

## 2011-05-05 MED ORDER — DOCUSATE SODIUM 100 MG PO CAPS: 100.0000 mg | ORAL_CAPSULE | Freq: Every day | ORAL | Status: DC | PRN

## 2011-05-05 NOTE — Progress Notes (Signed)
Presenting complaint; 2 knots at the lower abdomen. Subjective: Patient is a 66 year old Caucasian female with history of PBC in insufficiency anemia who is here for scheduled visit accompanied by her daughter Ann Ward. She complains of "2 knots in her abdomen". She does not have any pain associated with these lesions. She first noted about a 4 or 6 weeks ago. She has occasional constipation. She noted single episode of hematochezia about 2 months ago with straining. She has sporadic vomiting but denies hematemesis. She denies abdominal pain confusion or somnolence. Her weight has been stable since her last visit. Current Medications: Current Outpatient Prescriptions  Medication Sig Dispense Refill  . amitriptyline (ELAVIL) 10 MG tablet Take 10 mg by mouth at bedtime.        [name] b complex vitamins capsule Take 1 capsule by mouth daily.        [name] CALCIUM CARB-CHOLECALCIFEROL PO Take by mouth 2 (two) times daily.        . Cholecalciferol (VITAMIN D-3 PO) Take by mouth daily.        . ferrous sulfate 325 (65 FE) MG tablet Take 325 mg by mouth 2 (two) times daily.        . fexofenadine (ALLEGRA) 180 MG tablet Take 180 mg by mouth daily.        [name] FLUoxetine (PROZAC) 20 MG tablet Take 20 mg by mouth 2 (two) times daily.        . furosemide (LASIX) 20 MG tablet Take 20 mg by mouth. As needed       . gabapentin (NEURONTIN) 300 MG capsule Take 300 mg by mouth 4 (four) times daily.        . hydrochlorothiazide (HYDRODIURIL) 25 MG tablet Take 25 mg by mouth daily.        . Iron-Vitamins (GERITOL PO) Take by mouth daily.        . lansoprazole (PREVACID) 30 MG capsule Take 1 capsule (30 mg total) by mouth daily.  30 capsule  11  . LORazepam (ATIVAN) 1 MG tablet Take 1 mg by mouth. Takes 1 by mouth 4 times a day       . Probiotic Product (ALIGN) 4 MG CAPS Take by mouth daily.        . ursodiol (ACTIGALL) 300 MG capsule take 1 capsule by mouth three times a day  270 capsule  3  . vitamin C (ASCORBIC ACID) 500 MG  tablet Take 500 mg by mouth daily.        . Zinc 50 MG TABS Take by mouth daily.        [name] POTASSIUM CHLORIDE PO Take 20 mEq by mouth. Takes 1 by mouth only when she takes the Lasix         Objective: Blood pressure 140/74, pulse 78, temperature 98.9 F (37.2 C), temperature source Oral, resp. rate 16, height 5\' 4"  (1.626 m), weight 170 lb (77.111 kg). Patient is alert and does not have asterixis. Conjunctiva is pink. Sclera is nonicteric Oropharyngeal mucosa is normal. No neck masses or thyromegaly noted. Cardiac exam with regular rhythm normal S1 and S2. No murmur or gallop noted. Lungs are clear to auscultation. Abdomen is protuberant with hepato-and splenomegaly. No shifting dullness noted. She has 2 ventral herniae one to the right of lower midline scar and the other one across the lower part; both of these completely reducible; No LE edema or clubbing noted.  Assessment: #1. Patient has developed 2 ventral herniae around her hypogastric scar. Presently she  is asymptomatic. She should consider having these fixed before they get very large or she develops acute symptoms. Patient wants to think about it. #2. History of PBC. She remains on Urso. #3. History of IDA.  Plan: Surgical consultation and she is ready to have these herniae fixed. Patient will go to lab for CBC comprehensive chemistry panel serum iron and TIBC. Colace 100-200 mg by mouth each bedtime. Office visit 6 months.

## 2011-05-05 NOTE — Patient Instructions (Addendum)
Take Colace 100 mg by mouth every night. Can increase the dose to 2 tablets daily if one does not work. Physician will contact you with the results of blood work

## 2011-05-06 LAB — IRON AND TIBC
%SAT: 14 % — ABNORMAL LOW (ref 20–55)
Iron: 43 ug/dL (ref 42–145)
TIBC: 300 ug/dL (ref 250–470)

## 2011-05-06 LAB — CBC
HCT: 35.9 % — ABNORMAL LOW (ref 36.0–46.0)
MCH: 29.3 pg (ref 26.0–34.0)
MCV: 86.3 fL (ref 78.0–100.0)
RDW: 16.1 % — ABNORMAL HIGH (ref 11.5–15.5)
WBC: 4.6 10*3/uL (ref 4.0–10.5)

## 2011-05-06 LAB — COMPREHENSIVE METABOLIC PANEL
AST: 20 U/L (ref 0–37)
BUN: 8 mg/dL (ref 6–23)
Calcium: 9.6 mg/dL (ref 8.4–10.5)
Chloride: 87 mEq/L — ABNORMAL LOW (ref 96–112)
Creat: 0.78 mg/dL (ref 0.50–1.10)
Total Bilirubin: 0.7 mg/dL (ref 0.3–1.2)

## 2011-05-06 LAB — TSH: TSH: 1.439 u[IU]/mL (ref 0.350–4.500)

## 2011-05-08 NOTE — Progress Notes (Signed)
Noted in patient's chart under recall for a 4 month f/u.

## 2011-05-21 ENCOUNTER — Telehealth (INDEPENDENT_AMBULATORY_CARE_PROVIDER_SITE_OTHER): Payer: Self-pay | Admitting: *Deleted

## 2011-05-21 NOTE — Telephone Encounter (Signed)
Ann Ward called and her Mother is ready to have the hernia Surgery. She ask for a referral to [facility]. Per Dr. Karilyn Cota refer to [facility]: Drs. Hoxworth , Elvera Maria, Streck. Diagnosis: Ventral Hernia Ann Ward may be reached at [phone]

## 2011-05-22 NOTE — Telephone Encounter (Signed)
appt w/ Dr Derrell Lolling 3/14 @ 830, [name] is aware of appt info

## 2011-06-11 ENCOUNTER — Encounter (INDEPENDENT_AMBULATORY_CARE_PROVIDER_SITE_OTHER): Payer: Self-pay | Admitting: General Surgery

## 2011-06-11 ENCOUNTER — Ambulatory Visit (INDEPENDENT_AMBULATORY_CARE_PROVIDER_SITE_OTHER): Payer: Medicare Other | Admitting: General Surgery

## 2011-06-11 VITALS — BP 150/68 | HR 100 | Temp 97.8°F | Resp 18 | Ht 64.0 in | Wt 161.6 lb

## 2011-06-11 DIAGNOSIS — K439 Ventral hernia without obstruction or gangrene: Secondary | ICD-10-CM

## 2011-06-11 NOTE — Patient Instructions (Signed)
On examination I can feel two hernia defects in your lower abdominal wall. These will progress  unless something is done. You will be scheduled for a CT scan of the abdomen and pelvis.  Youwill be scheduled for a followup appointment with Dr. Derrell Lolling to discuss the CT scan findings and make final decisions about repairing the hernias surgically.

## 2011-06-11 NOTE — Progress Notes (Signed)
Patient ID: Ann Ward, female   DOB: January 19, 1946, 66 y.o.   MRN: [id number]  Chief Complaint  Patient presents with  . Hernia     HPI Ann Ward is a 66 y.o. female.  She is referred by Dr. Lionel December in [facility] for evaluation of ventral incisional hernias. Her primary care physician is Dr. Kirstie Peri.  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 She has borderline diabetes. She has iron deficiency anemia. She had a left total mastectomy in 2004 by Dr. Josephine Cables for stage II breast cancer. She had adjuvant radiation therapy to the chest wall and chemotherapy. Intra-abdominal surgery she had a tubal ligation many years ago. One year ago at she had an abdominal hysterectomy at [facility] in [place] through a lower midline incision. She states she had a  large, nonmalignant mass of either the uterus or ovaries.  She is known to have an enlarged liver and spleen due to her primary biliary cirrhosis.  She now presents with a three-month history of lumps in her abdomen. They do not cause any pain but they are getting larger. She is afraid of what will happen over time.  Last upper endoscopy and colonoscopy was one year ago which showed some gastritis but no malignancy. She is here today with her daughter. HPI  Past Medical History  Diagnosis Date  . PBC (primary biliary cirrhosis)   . Neuropathy   . Anxiety disorder   . Breast cancer   . History of tubal ligation   . Depression     Past Surgical History  Procedure Date  . Abdominal hysterectomy   . Mastectomy     Left  . Excisional hemorrhoidectomy   . Colonoscopy   . Upper gastrointestinal endoscopy   . Breast surgery   . Abdominal hysterectomy     Family History  Problem Relation Age of Onset  . Diabetes Mother   . Lung cancer Father   . Healthy Brother   . Heart disease Sister   . Healthy Daughter   .  Hypertension Son   . Hypertension Son     Social History History  Substance Use Topics  . Smoking status: Former Smoker    Types: Cigarettes    Quit date: 01/13/2007  . Smokeless tobacco: Never Used   Comment: patient smoked 1 PPD  . Alcohol Use: No    No Known Allergies  Current Outpatient Prescriptions  Medication Sig Dispense Refill  . amitriptyline (ELAVIL) 10 MG tablet Take 10 mg by mouth at bedtime.        [name] b complex vitamins capsule Take 1 capsule by mouth daily.        [name] CALCIUM CARB-CHOLECALCIFEROL PO Take by mouth 2 (two) times daily.        . Cholecalciferol (VITAMIN D-3 PO) Take by mouth daily.        [name] docusate sodium (COLACE) 100 MG capsule Take 1 capsule (100 mg total) by mouth daily as needed for constipation.  30 capsule  5  . ferrous sulfate 325 (65 FE) MG tablet Take 325 mg by mouth 2 (two) times daily.        . fexofenadine (ALLEGRA) 180 MG tablet Take 180 mg by mouth daily.        [name] FLUoxetine (PROZAC) 20 MG tablet Take 20 mg by mouth 2 (two) times daily.        [name]  furosemide (LASIX) 20 MG tablet Take 20 mg by mouth. As needed       . gabapentin (NEURONTIN) 300 MG capsule Take 300 mg by mouth 4 (four) times daily.        . hydrochlorothiazide (HYDRODIURIL) 25 MG tablet Take 25 mg by mouth daily.        . Iron-Vitamins (GERITOL PO) Take by mouth daily.        . lansoprazole (PREVACID) 30 MG capsule Take 1 capsule (30 mg total) by mouth daily.  30 capsule  11  . LORazepam (ATIVAN) 1 MG tablet Take 1 mg by mouth every 8 (eight) hours. Takes 1 by mouth 4 times a day      . POTASSIUM CHLORIDE PO Take 20 mEq by mouth. Takes 1 by mouth only when she takes the Lasix       . Probiotic Product (ALIGN) 4 MG CAPS Take by mouth daily.        . ursodiol (ACTIGALL) 300 MG capsule take 1 capsule by mouth three times a day  270 capsule  3  . vitamin C (ASCORBIC ACID) 500 MG tablet Take 500 mg by mouth daily.        . Zinc 50 MG TABS Take by mouth daily.          Review of  Systems Review of Systems  Constitutional: Negative for fever, chills and unexpected weight change.  HENT: Negative for hearing loss, congestion, sore throat, trouble swallowing and voice change.   Eyes: Negative for visual disturbance.  Respiratory: Negative for cough and wheezing.   Cardiovascular: Negative for chest pain, palpitations and leg swelling.  Gastrointestinal: Positive for abdominal distention. Negative for nausea, vomiting, abdominal pain, diarrhea, constipation, blood in stool and anal bleeding.  Genitourinary: Negative for hematuria, vaginal bleeding and difficulty urinating.  Musculoskeletal: Negative for arthralgias.  Skin: Negative for rash and wound.  Neurological: Negative for seizures, syncope and headaches.  Hematological: Negative for adenopathy. Does not bruise/bleed easily.  Psychiatric/Behavioral: Negative for confusion.    Blood pressure 150/68, pulse 100, temperature 97.8 F (36.6 C), resp. rate 18, height 5\' 4"  (1.626 m), weight 161 lb 9.6 oz (73.301 kg).  Physical Exam Physical Exam  Constitutional: She is oriented to person, place, and time. She appears well-developed and well-nourished. No distress.  HENT:  Head: Normocephalic and atraumatic.  Nose: Nose normal.  Mouth/Throat: No oropharyngeal exudate.  Eyes: Conjunctivae and EOM are normal. Pupils are equal, round, and reactive to light. Left eye exhibits no discharge. No scleral icterus.  Neck: Neck supple. No JVD present. No tracheal deviation present. No thyromegaly present.  Cardiovascular: Normal rate, regular rhythm, normal heart sounds and intact distal pulses.   No murmur heard. Pulmonary/Chest: Effort normal and breath sounds normal. No respiratory distress. She has no wheezes. She has no rales. She exhibits no tenderness.  Abdominal: Soft. Bowel sounds are normal. She exhibits no distension and no mass. There is no tenderness. There is no rebound and no guarding.         Lower midline  scar. There are 2 hernias one in the upper aspect of the scar on the right and one in the lower aspect of the scar on the left. They are both reducible.  Musculoskeletal: She exhibits no edema and no tenderness.  Lymphadenopathy:    She has no cervical adenopathy.  Neurological: She is alert and oriented to person, place, and time. She exhibits normal muscle tone. Coordination normal.  Skin: Skin is warm.  No rash noted. She is not diaphoretic. No erythema. No pallor.  Psychiatric: She has a normal mood and affect. Her behavior is normal. Judgment and thought content normal.    Data Reviewed I have reviewed Dr. Patty Sermons office notes and recent lab work. Assessment    Multiple ventral incisional hernias, related to lower midline incision. Minimal pain. Progressive enlargement over one year.  One year postop open hysterectomy at [facility] for benign mass.  Primary biliary cirrhosis with splenomegaly and hepatomegaly by history  Chronic anemia  Borderline diabetes  History left mastectomy for stage II cancer, radiation therapy left chest wall, and adjuvant chemotherapy. None of recurrence to date.  Hypertension  Anxiety and depression   Plan    We will attempt to obtain the records from [facility] regarding her hysterectomy, which will be helpful in terms of the operative findings.  Proceed with CT scan of abdomen and pelvis with contrast to define the extent of hernias and any other intra-abdominal pathology to be aware of.  She will return to see me in 4 weeks to discuss these findings and make final decisions regarding surgical repair.  She is strongly motivated towards a elective surgical repair, and we discussed different techniques including laparoscopic versus open, details of the procedures and their risks. She is comfortable with this. She understands the discussion. I will see her back after the CT scan is done.       Angelia Mould. Derrell Lolling, M.D.,  [facility] General and Minimally invasive Surgery Breast and Colorectal Surgery Office:   [phone] Pager:   [phone]  06/11/2011, 9:33 AM

## 2011-06-12 ENCOUNTER — Ambulatory Visit (HOSPITAL_COMMUNITY)
Admission: RE | Admit: 2011-06-12 | Discharge: 2011-06-12 | Disposition: A | Discharge status: Home / Self Care | Payer: Medicare Other | Source: Ambulatory Visit | Attending: General Surgery | Admitting: General Surgery

## 2011-06-12 DIAGNOSIS — K838 Other specified diseases of biliary tract: Secondary | ICD-10-CM | POA: Insufficient documentation

## 2011-06-12 DIAGNOSIS — K5710 Diverticulosis of small intestine without perforation or abscess without bleeding: Secondary | ICD-10-CM | POA: Insufficient documentation

## 2011-06-12 DIAGNOSIS — K439 Ventral hernia without obstruction or gangrene: Secondary | ICD-10-CM | POA: Insufficient documentation

## 2011-06-12 MED ORDER — IOHEXOL 300 MG/ML  SOLN
100.0000 mL | Freq: Once | INTRAMUSCULAR | Status: AC | PRN
  Administered 2011-06-12: 100 mL via INTRAVENOUS

## 2011-06-18 ENCOUNTER — Telehealth (INDEPENDENT_AMBULATORY_CARE_PROVIDER_SITE_OTHER): Payer: Self-pay

## 2011-06-18 ENCOUNTER — Telehealth (INDEPENDENT_AMBULATORY_CARE_PROVIDER_SITE_OTHER): Payer: Self-pay | Admitting: *Deleted

## 2011-06-18 NOTE — Telephone Encounter (Signed)
Dr. Derrell Lolling would like Dr. Karilyn Cota to order an endoscopic EGD, endoscopic UC and glabbader UC.  Dr. Jacinto Halim notes and report for the CT are in EPIC. IF you have any questions, Dr. Jacinto Halim return phone number is [phone]. I have scheduled an office visit with Ann Ward for 07/20/11 at 3:45.

## 2011-06-18 NOTE — Telephone Encounter (Signed)
Per Dr Jacinto Halim request Pt's daughter notified of ct result and need for more work up with Dr Karilyn Cota. Pt needs EGD with EUS and GB U/S done prior to scheduling hernia repair. I have spoken with Lupita Leash at Dr Patty Sermons office and set f/u appt with his office for 07-20-11. Lupita Leash given request for above testing and recommendations from Dr Karilyn Cota. Pts daughter aware of date of appt and will assist pt in keeping appts. She will call our office one these tests done so we can make f/u appt with Dr Derrell Lolling to review these tests and discuss hernia repair.

## 2011-06-22 NOTE — Telephone Encounter (Signed)
We can schedule her for EGD our office visit if she wants to be seen prior to the procedure

## 2011-06-23 NOTE — Telephone Encounter (Signed)
Discussed with Dr Karilyn Cota, will wait for his to discuss with patient at office visit

## 2011-07-08 ENCOUNTER — Telehealth (INDEPENDENT_AMBULATORY_CARE_PROVIDER_SITE_OTHER): Payer: Self-pay

## 2011-07-08 NOTE — Telephone Encounter (Signed)
I spoke with [name] pts daughter. Pt has appt with GI 4-22 to discuss setting up testing. We will cx 4-23 appt with Dr Derrell Lolling and r/s once we know date of EGD u/s and u/s. She will call me once dates set up.

## 2011-07-20 ENCOUNTER — Ambulatory Visit (INDEPENDENT_AMBULATORY_CARE_PROVIDER_SITE_OTHER): Payer: Medicare Other | Admitting: Internal Medicine

## 2011-07-20 ENCOUNTER — Encounter (INDEPENDENT_AMBULATORY_CARE_PROVIDER_SITE_OTHER): Payer: Self-pay | Admitting: Internal Medicine

## 2011-07-20 VITALS — BP 130/70 | HR 84 | Temp 97.1°F | Resp 18 | Ht 64.0 in | Wt 170.1 lb

## 2011-07-20 DIAGNOSIS — K745 Biliary cirrhosis, unspecified: Secondary | ICD-10-CM

## 2011-07-20 DIAGNOSIS — Z79899 Other long term (current) drug therapy: Secondary | ICD-10-CM

## 2011-07-20 DIAGNOSIS — Z5189 Encounter for other specified aftercare: Secondary | ICD-10-CM

## 2011-07-20 DIAGNOSIS — K743 Primary biliary cirrhosis: Secondary | ICD-10-CM

## 2011-07-20 DIAGNOSIS — D509 Iron deficiency anemia, unspecified: Secondary | ICD-10-CM

## 2011-07-20 DIAGNOSIS — D649 Anemia, unspecified: Secondary | ICD-10-CM

## 2011-07-20 NOTE — Progress Notes (Addendum)
Presenting complaint;  to discuss results of recent abdominopelvic CT and further workup of abnormality  involving head of pancreas Subjective:  Ann Ward is 66 year old Caucasian female who is here for scheduled visit accompanied by her son Ann Ward. Her daughter Ann Ward could not come today because she is on her honeymoon. On her last visit of 68 08/17/2011 she was noted to have ventral herniae. She was complaining of pain meds she was to prevent forward. She was therefore referred to Dr. Claud Kelp of [facility] surgery. He obtained abdominopelvic CT which revealed ventral hernia containing a segment of small bowel. Study also showed inflammatory changes posterior and inferior to head and uncinate process of pancreas. 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 She had either sludge or small stones in the gallbladder. With this information Dr. Derrell Lolling felt he needed further GI evaluation before surgery considered. She continues to complain of fullness and pain when she bends or stoops forward. She states she cannot work. She is not having any sharp pain. She remains with irregular bowel movements. She either has diarrhea and her constipation but denies melena or rectal bleeding. Her appetite is up and down but she has not lost any weight since her last visit. She does walk every now and then for health reasons but not every day. She denies confusion or difficulty sleeping or sleeping too much.   Current Medications: Current Outpatient Prescriptions  Medication Sig Dispense Refill  . b complex vitamins capsule Take 1 capsule by mouth daily.        [name] CALCIUM CARB-CHOLECALCIFEROL PO Take by mouth 2 (two) times daily.        . Cholecalciferol (VITAMIN D-3 PO) Take by mouth daily.        [name] docusate sodium (COLACE) 100 MG capsule Take 1 capsule (100 mg total) by mouth daily as needed for constipation.  30 capsule  5  . ferrous sulfate  325 (65 FE) MG tablet Take 325 mg by mouth 2 (two) times daily.        . fexofenadine (ALLEGRA) 180 MG tablet Take 180 mg by mouth daily.        [name] FLUoxetine (PROZAC) 20 MG tablet Take 20 mg by mouth 2 (two) times daily.        . furosemide (LASIX) 20 MG tablet Take 20 mg by mouth. As needed       . gabapentin (NEURONTIN) 300 MG capsule Take 300 mg by mouth 4 (four) times daily.        . hydrochlorothiazide (HYDRODIURIL) 25 MG tablet Take 25 mg by mouth daily.        . hydrOXYzine (VISTARIL) 25 MG capsule Take 25 mg by mouth. Patient states that she is taking ( 2) 25 mg tablets at bedtime      . Iron-Vitamins (GERITOL PO) Take by mouth daily.        . lansoprazole (PREVACID) 30 MG capsule Take 1 capsule (30 mg total) by mouth daily.  30 capsule  11  . LORazepam (ATIVAN) 1 MG tablet Take 1 mg by mouth every 8 (eight) hours. Takes 1 by mouth 2 times a day      . POTASSIUM CHLORIDE PO Take 20 mEq by mouth. Takes 1 by mouth only when she takes the Lasix       . Probiotic Product (ALIGN) 4 MG CAPS Take by mouth daily.        [name]  ursodiol (ACTIGALL) 300 MG capsule take 1 capsule by mouth three times a day  270 capsule  3  . vitamin C (ASCORBIC ACID) 500 MG tablet Take 500 mg by mouth daily.        . Zinc 50 MG TABS Take by mouth daily.          Objective: Blood pressure 130/70, pulse 84, temperature 97.1 F (36.2 C), temperature source Oral, resp. rate 18, height 5\' 4"  (1.626 m), weight 170 lb 1.6 oz (77.157 kg). Patient is alert. She has slight tremors to outstretched hands but questionable asterixis. Conjunctiva is pink. Sclera is nonicteric Oropharyngeal mucosa is normal. No neck masses or thyromegaly noted. Cardiac exam with regular rhythm normal S1 S2. No murmur or gallop noted. Lungs are clear to auscultation. Abdomen abdomen is  full 2 bulges around lower midline scar. She has hepatosplenomegaly left lobe of the liver is prominent. Liver is awfully tender but smooth surface. Spleen is also  palpable. No shifting dullness noted. No LE edema or clubbing noted.  Labs/studies Results: Abdominopelvic CT from 06/12/2011  reviewed. Findings as above.  Assessment:  #1 Pancreatic abnormality seen on CT possibly related to portal hypertension. She could possibly have suffered an episode of biliary pancreatitis given her bile duct is mildly dilated and she may have cholelithiasis. However there is no history of abdominal pain. Patient did have an ultrasound in November 2011 showing sludge and/or small stones in the gallbladder. Even though we are comparing 2 different studies gallbladder findings appear to be unchanged. Patient's last EGD was in January 2012 revealing grade 1 esophageal varices. She will need an EGD at some point. History of iron deficiency anemia. Last hemoglobin was 12.2 .   Plan:  The review of abdominal pelvic CT with Dr. Tyron Russell associates for another opinion before endoscopic ultrasound requested. She can be assessed for varices with EUS. CBC, metabolic 7 and serum ammonia. Will contact Dr. Derrell Lolling when a GI evaluation completed.      Addendum dated 07/21/2011. I reviewed patient's abdominopelvic CT from 06/12/2011 by Dr. Massie Kluver and compared with the study from November 2011. While bile duct was 7 mm pancreatic head abnormality appears to be less pronounced on current study and appears to be most likely due to lymph nodes which appear smaller on this study. Therefore no further workup needed. I also talked to Dr. Derrell Lolling over the phone. His office will contact patient and arrange for followup visit and proceed with repair of ventral/incisional hernia.

## 2011-07-20 NOTE — Patient Instructions (Signed)
Physician will call you with the results of blood work and recommendations regarding further evaluation of pancreas after CT  reviewed with radiologist.

## 2011-07-21 ENCOUNTER — Ambulatory Visit (INDEPENDENT_AMBULATORY_CARE_PROVIDER_SITE_OTHER): Payer: Medicare Other | Admitting: General Surgery

## 2011-07-21 LAB — CBC
MCH: 29.6 pg (ref 26.0–34.0)
MCHC: 32.9 g/dL (ref 30.0–36.0)
Platelets: 70 10*3/uL — ABNORMAL LOW (ref 150–400)
RBC: 3.72 MIL/uL — ABNORMAL LOW (ref 3.87–5.11)

## 2011-07-21 LAB — BASIC METABOLIC PANEL WITH GFR
Calcium: 8.9 mg/dL (ref 8.4–10.5)
GFR, Est African American: >89 mL/min
GFR, Est Non African American: 79 mL/min
Sodium: 130 mEq/L — ABNORMAL LOW (ref 135–145)

## 2011-07-21 LAB — AMMONIA: Ammonia: 52 umol/L (ref 16–53)

## 2011-07-29 ENCOUNTER — Telehealth (INDEPENDENT_AMBULATORY_CARE_PROVIDER_SITE_OTHER): Payer: Self-pay

## 2011-07-29 NOTE — Telephone Encounter (Signed)
Message copied by Joanette Gula on Wed Jul 29, 2011 10:38 AM ------      Message from: Marnette Burgess      Created: Wed Jul 29, 2011 10:06 AM      Contact: [name] [phone]       Patient has an appointment to see Dr. Cleone Slim on 08/12/11, she says that Dr. Karilyn Cota spoke to her and said that her mother was doing a little better so she was told that some of the tests that Dr. Cleone Slim had requested were not going to be performed.  So she wants to make sure that when she comes in w/her mother for this visit it more like a pre op appt and not anoter day for testing, please call.

## 2011-07-29 NOTE — Telephone Encounter (Signed)
I have forwarded appt question to Dr Derrell Lolling to review. Will be next week when he returns before we will have answer.

## 2011-08-04 ENCOUNTER — Telehealth (INDEPENDENT_AMBULATORY_CARE_PROVIDER_SITE_OTHER): Payer: Self-pay

## 2011-08-04 NOTE — Telephone Encounter (Signed)
[name] states pt is seeing her psyc MD because of mental changes related to pts meds. I reviewed this with Dr Derrell Lolling and advised [name] as long as pt is not have incarceration symptoms she should wait until psyc MD regulates meds and pt is not having hallucinations. We have r/s ov for after her next psyc visit.

## 2011-08-12 ENCOUNTER — Ambulatory Visit (INDEPENDENT_AMBULATORY_CARE_PROVIDER_SITE_OTHER): Payer: Medicare Other | Admitting: General Surgery

## 2011-09-01 DIAGNOSIS — G459 Transient cerebral ischemic attack, unspecified: Secondary | ICD-10-CM

## 2011-09-07 ENCOUNTER — Ambulatory Visit (INDEPENDENT_AMBULATORY_CARE_PROVIDER_SITE_OTHER): Payer: Medicare Other | Admitting: General Surgery

## 2011-09-07 ENCOUNTER — Encounter (INDEPENDENT_AMBULATORY_CARE_PROVIDER_SITE_OTHER): Payer: Self-pay | Admitting: General Surgery

## 2011-09-07 VITALS — BP 146/84 | HR 102 | Temp 97.8°F | Resp 14 | Ht 64.0 in | Wt 159.0 lb

## 2011-09-07 DIAGNOSIS — K439 Ventral hernia without obstruction or gangrene: Secondary | ICD-10-CM

## 2011-09-07 NOTE — Patient Instructions (Signed)
You will be scheduled for laparoscopic surgery to repair  your multiple ventral hernias with mesh. This operation may have to be converted to an open operation. We will schedule this at [facility] in the future.

## 2011-09-07 NOTE — Progress Notes (Signed)
Patient ID: Ann Ward, female   DOB: [birth year], 66 y.o.   MRN: [id number]  Chief Complaint  Patient presents with  . Follow-up    Dicuss hernia Sx    HPI Ann Ward is a 66 y.o. female.  This patient returns with her daughter to discuss and plan surgery for her multiple ventral hernias.  She has been thoroughly evaluated by Dr. Dorris Carnes. Rehman.  He has looked at her imaging studies which show inflammatory changes around the head of the pancreas, some sludge in the GB, borderline dilatedcommon bile duct.His assessment were of that these areas are stable and explained by her portal hypertension. He did not think she would benefit from EGD.  She still feels the bulges in her abdomen. She thinks they are larger. Ann Ward She was treated for breast cancer in 2001. 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  She is known to have grade 1 esophageal varices and imaging findings suggestive of portal hypertension  Recently hospitallized in [place] for hyponatremia. HPI  Past Medical History  Diagnosis Date  . PBC (primary biliary cirrhosis)   . Neuropathy   . Anxiety disorder   . Breast cancer   . History of tubal ligation   . Depression     Past Surgical History  Procedure Date  . Abdominal hysterectomy   . Mastectomy     Left  . Excisional hemorrhoidectomy   . Colonoscopy   . Upper gastrointestinal endoscopy   . Breast surgery   . Abdominal hysterectomy     Family History  Problem Relation Age of Onset  . Diabetes Mother   . Lung cancer Father   . Healthy Brother   . Heart disease Sister   . Healthy Daughter   . Hypertension Son   . Hypertension Son     Social History History  Substance Use Topics  . Smoking status: Former Smoker    Types: Cigarettes    Quit date: 01/13/2007  . Smokeless tobacco: Never Used   Comment: patient smoked 1 PPD  . Alcohol Use: No    No Known Allergies  Current Outpatient Prescriptions  Medication Sig Dispense Refill  . b complex  vitamins capsule Take 1 capsule by mouth daily.        Ann Ward CALCIUM CARB-CHOLECALCIFEROL PO Take by mouth 2 (two) times daily.        . Cholecalciferol (VITAMIN D-3 PO) Take by mouth daily.        Ann Ward docusate sodium (COLACE) 100 MG capsule Take 1 capsule (100 mg total) by mouth daily as needed for constipation.  30 capsule  5  . ferrous sulfate 325 (65 FE) MG tablet Take 325 mg by mouth 2 (two) times daily.        . fexofenadine (ALLEGRA) 180 MG tablet Take 180 mg by mouth daily.        Ann Ward FLUoxetine (PROZAC) 20 MG tablet Take 20 mg by mouth 2 (two) times daily.        . furosemide (LASIX) 20 MG tablet Take 20 mg by mouth. As needed       . gabapentin (NEURONTIN) 300 MG capsule Take 300 mg by mouth 4 (four) times daily.        . hydrOXYzine (VISTARIL) 25 MG capsule Take 25 mg by mouth. Patient states that she is taking ( 2) 25 mg tablets at bedtime      . Iron-Vitamins (GERITOL PO) Take by mouth daily.        Ann Ward  lansoprazole (PREVACID) 30 MG capsule Take 1 capsule (30 mg total) by mouth daily.  30 capsule  11  . LORazepam (ATIVAN) 1 MG tablet Take 1 mg by mouth every 8 (eight) hours. Takes 1 by mouth 2 times a day      . POTASSIUM CHLORIDE PO Take 20 mEq by mouth. Takes 1 by mouth only when she takes the Lasix       . Probiotic Product (ALIGN) 4 MG CAPS Take by mouth daily.        . ursodiol (ACTIGALL) 300 MG capsule take 1 capsule by mouth three times a day  270 capsule  3  . vitamin C (ASCORBIC ACID) 500 MG tablet Take 500 mg by mouth daily.        . Zinc 50 MG TABS Take by mouth daily.          Review of Systems Review of Systems  Constitutional: Negative for fever, chills and unexpected weight change.  HENT: Negative for hearing loss, congestion, sore throat, trouble swallowing and voice change.   Eyes: Negative for visual disturbance.  Respiratory: Negative for cough and wheezing.   Cardiovascular: Positive for chest pain. Negative for palpitations and leg swelling.  Gastrointestinal:  Positive for abdominal pain and abdominal distention. Negative for nausea, vomiting, diarrhea, constipation, blood in stool and anal bleeding.  Genitourinary: Negative for hematuria, vaginal bleeding and difficulty urinating.  Musculoskeletal: Negative for arthralgias.  Skin: Negative for rash and wound.  Neurological: Negative for seizures, syncope and headaches.  Hematological: Negative for adenopathy. Does not bruise/bleed easily.  Psychiatric/Behavioral: Positive for confusion.    Blood pressure 146/84, pulse 102, temperature 97.8 F (36.6 C), temperature source Temporal, resp. rate 14, height 5\' 4"  (1.626 m), weight 159 lb (72.122 kg).  Physical Exam Physical Exam  Constitutional: She is oriented to person, place, and time. She appears well-developed and well-nourished. No distress.  HENT:  Head: Normocephalic and atraumatic.  Nose: Nose normal.  Mouth/Throat: No oropharyngeal exudate.  Eyes: Conjunctivae and EOM are normal. Pupils are equal, round, and reactive to light. Left eye exhibits no discharge. No scleral icterus.  Neck: Neck supple. No JVD present. No tracheal deviation present. No thyromegaly present.  Cardiovascular: Normal rate, regular rhythm, normal heart sounds and intact distal pulses.   No murmur heard. Pulmonary/Chest: Effort normal and breath sounds normal. No respiratory distress. She has no wheezes. She has no rales. She exhibits no tenderness.  Abdominal: Soft. Bowel sounds are normal. She exhibits no distension and no mass. There is no tenderness. There is no rebound and no guarding.    Musculoskeletal: She exhibits no edema and no tenderness.  Lymphadenopathy:    She has no cervical adenopathy.  Neurological: She is alert and oriented to person, place, and time. She exhibits normal muscle tone. Coordination normal.  Skin: Skin is warm. No rash noted. She is not diaphoretic. No erythema. No pallor.  Psychiatric: She has a normal mood and affect. Her  behavior is normal. Judgment and thought content normal.    Data Reviewed Dr. Patty Sermons notes on Epic, CT scans.  Assessment    At least 2 ventral incisional hernias, lower midline. The lower one is above the symphysis pubis but only by a few centimeters. These are symptomatic and the patient strongly desires surgical repair.  Primary biliary cirrhosis, presumably secondary to chemotherapy for breast cancer  Portal hypertension  Grade 1 esophageal varices  Status post abdominal hysterectomy debris 2012 next status post left total mastectomy 2001  Plan    Will schedule for laparoscopic repair of ventral incisional hernias with mesh, possible open.  I explained to her that she is at increased risk for conversion to an open operation because of the varices and the proximity to the symphysis pubis. I told her that she is increased risk for postoperative delirium because of the combination of anesthesia and liver disease. I told her she was at increased risk for problems with wound healing and recurrence of the hernia. I discussed the indications, techniques, and the numerous risks of this operation with the patient and her daughter. All of her questions were answered. She understands these issues. She agrees with the plan for surgery.      Angelia Mould. Derrell Lolling, M.D., [facility] General and Minimally invasive Surgery Breast and Colorectal Surgery Office:   [phone] Pager:   [phone]  09/07/2011, 4:18 PM

## 2011-09-25 ENCOUNTER — Telehealth (INDEPENDENT_AMBULATORY_CARE_PROVIDER_SITE_OTHER): Payer: Self-pay | Admitting: General Surgery

## 2011-09-25 NOTE — Telephone Encounter (Signed)
Pt's daughter calling to let Dr. Derrell Lolling know her mother was being admitted to [facility] with acute cholecystitis.  Not sure what course will be (surgery or not), but will let us know about her scheduled surgery with [facility] on 10/23/11. FYI.

## 2011-09-29 ENCOUNTER — Telehealth (INDEPENDENT_AMBULATORY_CARE_PROVIDER_SITE_OTHER): Payer: Self-pay | Admitting: General Surgery

## 2011-09-29 NOTE — Telephone Encounter (Signed)
Called back patient daughter based on message received yesterday. She stated that her mother was very nauseated from 09/23/11 until 09/26/11. Was admitted by Dr. Clelia Croft to the hospital [facility]) on Wednesday. Stated the patient was dealing with confusion during stay due to not receiving her regular medication. [name] (daughter) stated that was corrected and her mother got back to normal. She stated that Dr. Clelia Croft advised to proceed with surgery scheduled on 10/23/11. Her mother has been released from the hospital and is back at home.  I asked [name] to please contact Dr. Clelia Croft to have him fax over progress notes and results of tests done as Dr. Derrell Lolling would want to review that information. I advised that once received and reviewed I would have that information scanned to be added to chart here.

## 2011-10-09 ENCOUNTER — Encounter (HOSPITAL_COMMUNITY): Payer: Self-pay | Admitting: Pharmacy Technician

## 2011-10-16 ENCOUNTER — Encounter (HOSPITAL_COMMUNITY)
Admission: RE | Admit: 2011-10-16 | Discharge: 2011-10-16 | Disposition: A | Discharge status: Home / Self Care | Payer: Medicare Other | Source: Ambulatory Visit | Attending: General Surgery | Admitting: General Surgery

## 2011-10-16 ENCOUNTER — Encounter (HOSPITAL_COMMUNITY): Payer: Self-pay

## 2011-10-16 HISTORY — DX: Unspecified urinary incontinence: R32

## 2011-10-16 HISTORY — DX: Anemia, unspecified: D64.9

## 2011-10-16 HISTORY — DX: Urinary tract infection, site not specified: N39.0

## 2011-10-16 HISTORY — DX: Cardiac murmur, unspecified: R01.1

## 2011-10-16 HISTORY — DX: Unspecified osteoarthritis, unspecified site: M19.90

## 2011-10-16 HISTORY — DX: Anxiety disorder, unspecified: F41.9

## 2011-10-16 HISTORY — DX: Bronchitis, not specified as acute or chronic: J40

## 2011-10-16 HISTORY — DX: Gastro-esophageal reflux disease without esophagitis: K21.9

## 2011-10-16 HISTORY — DX: Unspecified hemorrhoids: K64.9

## 2011-10-16 HISTORY — DX: Ventral hernia without obstruction or gangrene: K43.9

## 2011-10-16 HISTORY — DX: Headache: R51

## 2011-10-16 HISTORY — DX: Splenomegaly, not elsewhere classified: R16.1

## 2011-10-16 LAB — SURGICAL PCR SCREEN: Staphylococcus aureus: NEGATIVE

## 2011-10-16 LAB — DIFFERENTIAL
Lymphocytes Relative: 19 % (ref 12–46)
Lymphs Abs: 0.8 10*3/uL (ref 0.7–4.0)
Monocytes Relative: 8 % (ref 3–12)
Neutro Abs: 3.1 10*3/uL (ref 1.7–7.7)
Neutrophils Relative %: 71 % (ref 43–77)

## 2011-10-16 LAB — COMPREHENSIVE METABOLIC PANEL
Albumin: 4 g/dL (ref 3.5–5.2)
BUN: 8 mg/dL (ref 6–23)
Calcium: 9.9 mg/dL (ref 8.4–10.5)
GFR calc Af Amer: 82 mL/min — ABNORMAL LOW (ref >90)
Glucose, Bld: 90 mg/dL (ref 70–99)
Total Protein: 8.2 g/dL (ref 6.0–8.3)

## 2011-10-16 LAB — CBC
HCT: 36.6 % (ref 36.0–46.0)
Hemoglobin: 12 g/dL (ref 12.0–15.0)
MCH: 28 pg (ref 26.0–34.0)
MCHC: 32.8 g/dL (ref 30.0–36.0)
RDW: 14.8 % (ref 11.5–15.5)

## 2011-10-16 LAB — APTT: aPTT: 35 seconds (ref 24–37)

## 2011-10-16 LAB — PROTIME-INR
INR: 1.31 (ref 0.00–1.49)
Prothrombin Time: 16.5 seconds — ABNORMAL HIGH (ref 11.6–15.2)

## 2011-10-16 NOTE — Progress Notes (Addendum)
Referred abnormal labs and EKG to Allison for review. 

## 2011-10-16 NOTE — Pre-Procedure Instructions (Signed)
20 Ann Ward  10/16/2011   Your procedure is scheduled on:  Friday October 23, 2011  Report to [facility] at 5:30 AM.  Call this number if you have problems the morning of surgery: [phone]   Remember:   Do not eat food or drinks After Midnight.    Take these medicines the morning of surgery with A SIP OF WATER: amlodipine,allegra, prozac, gabapentin, prevacid, ativan, ursodiol   DISCONTINUE ASPIRIN, COUMADIN, PLAVIX, EFFICIENT, AND HERBAL MEDICATIONS   Do not wear jewelry, make-up or nail polish.  Do not wear lotions, powders, or perfumes. You may wear deodorant.  Do not shave 48 hours prior to surgery. Men may shave face and neck.  Do not bring valuables to the hospital.  Contacts, dentures or bridgework may not be worn into surgery.  Leave suitcase in the car. After surgery it may be brought to your room.  For patients admitted to the hospital, checkout time is 11:00 AM the day of discharge.   Patients discharged the day of surgery will not be allowed to drive home.  Name and phone number of your driver: Fuller Plan [phone]  Special Instructions: CHG Shower Use Special Wash: 1/2 bottle night before surgery and 1/2 bottle morning of surgery.   Please read over the following fact sheets that you were given: Pain Booklet, Coughing and Deep Breathing, MRSA Information and Surgical Site Infection Prevention

## 2011-10-19 NOTE — Consult Note (Signed)
Anesthesia Chart Review:  Patient is a 66 year old female scheduled for laparoscopic, possible open, VHR by Dr. Derrell Lolling on 10/23/11.  History includes primary biliary cirrhosis (possibly related to chemotherapy) followed by Dr. Lionel December with grade 1 esophageal varices and imaging findings suggestive of portal HTN, splenomegaly, thrombocytopenia, anxiety, depression, neuropathy breast cancer '04 s/p left mastectomy and chemoradiation, TAH '12, borderline DM, iron deficiency anemia, GERD, headaches, .  Of note, she was admitted to [facility] from  6/26-6/29/13 for "nausea, vomiting, probably from gastroenteritis" in the setting of known cholelithiasis, but reportedly was told by her PCP Dr. Kirstie Peri that she should still proceed with the planned procedure.  [facility] was notified.  Dr. Karilyn Cota saw her pre-operatively and did not recommend further work-up prior to this procedure (see his 07/21/11 note).  Labs on 10/16/11 showed normal AST/ALT, Cr, H/H.  PLT were 102 (up from 70 on 07/20/11).  PT was mildly elevated at 16.5, but her INR and PTT were WNL.    EKG from 10/16/11 showed ST @ 102, LAD, septal infarct (age undetermined).  I reviewed a comparison EKG from [facility] from 06/12/09.  The copy is dark, but overall I think it appears stable when compared to her most recent EKG.    Echo on 02/14/10 showed: - Left ventricle: The cavity size was normal. Borderline concentric hypertrophy. Systolic function was normal. The estimated ejection fraction was in the range of 55% to 60%. Wall motion was normal; there were no regional wall motion abnormalities. - Mitral valve: Mild regurgitation. - Atrial septum: No defect or patent foramen ovale was identified. - Tricuspid valve: Moderate regurgitation. - Pulmonary arteries: Systolic pressure was mildly increased. PA peak pressure: 49mm Hg (S). - Pericardium, extracardiac: There was a small right pleural effusion.  There was no active disease on CXR from 10/16/11.      Anticipate she can proceed if there is no significant change in her status.  Shonna Chock, PA-C

## 2011-10-22 MED ORDER — CEFAZOLIN SODIUM-DEXTROSE 2-3 GM-% IV SOLR
2.0000 g | INTRAVENOUS | Status: AC
  Administered 2011-10-23: 2 g via INTRAVENOUS
  Filled 2011-10-22: qty 50

## 2011-10-22 NOTE — H&P (Signed)
Ann Ward   t  MRN: [id number]   Description: 66 year old female  Provider: Ernestene Mention, MD  Department: [facility]       Diagnoses     Ventral hernia   - Primary    553.20         Vitals - Last Recorded     BP Pulse Temp Resp Ht Wt    146/84 102 97.8 F (36.6 C) (Temporal) 14 5\' 4"  (1.626 m) 159 lb (72.122 kg)   BMI - 27.29 kg/m2                 History and Physical     Ernestene Mention, MD   Patient ID: Ann Ward, female   DOB: 12-24-1945, 67 y.o.   MRN: [id number]              HPI Ann Ward is a 66 y.o. female.  This patient returns with her daughter to discuss and plan surgery for her multiple ventral hernias.   She has been thoroughly evaluated by Dr. Dorris Carnes. Rehman.  He has looked at her imaging studies which show inflammatory changes around the head of the pancreas, some sludge in the GB, borderline dilatedcommon bile duct.His assessment were of that these areas are stable and explained by her portal hypertension. He did not think she would benefit from EGD.   She still feels the bulges in her abdomen. She thinks they are larger. [name] She was treated for breast cancer in 2001. 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   She is known to have grade 1 esophageal varices and imaging findings suggestive of portal hypertension   Recently hospitallized in [place] for hyponatremia.     Past Medical History   Diagnosis  Date   .  PBC (primary biliary cirrhosis)     .  Neuropathy     .  Anxiety disorder     .  Breast cancer     .  History of tubal ligation     .  Depression         Past Surgical History   Procedure  Date   .  Abdominal hysterectomy     .  Mastectomy         Left   .  Excisional hemorrhoidectomy     .  Colonoscopy     .  Upper gastrointestinal endoscopy     .  Breast surgery     .  Abdominal hysterectomy         Family History   Problem  Relation  Age of Onset   .  Diabetes  Mother     .  Lung cancer   Father     .  Healthy  Brother     .  Heart disease  Sister     .  Healthy  Daughter     .  Hypertension  Son     .  Hypertension  Son        Social History History   Substance Use Topics   .  Smoking status:  Former Smoker       Types:  Cigarettes       Quit date:  01/13/2007   .  Smokeless tobacco:  Never Used     Comment: patient smoked 1 PPD   .  Alcohol Use:  No      No Known Allergies    Current Outpatient Prescriptions   Medication  Sig  Dispense  Refill   .  b complex vitamins capsule  Take 1 capsule by mouth daily.           [name]  CALCIUM CARB-CHOLECALCIFEROL PO  Take by mouth 2 (two) times daily.           .  Cholecalciferol (VITAMIN D-3 PO)  Take by mouth daily.           [name]  docusate sodium (COLACE) 100 MG capsule  Take 1 capsule (100 mg total) by mouth daily as needed for constipation.   30 capsule   5   .  ferrous sulfate 325 (65 FE) MG tablet  Take 325 mg by mouth 2 (two) times daily.           .  fexofenadine (ALLEGRA) 180 MG tablet  Take 180 mg by mouth daily.           [name]  FLUoxetine (PROZAC) 20 MG tablet  Take 20 mg by mouth 2 (two) times daily.           .  furosemide (LASIX) 20 MG tablet  Take 20 mg by mouth. As needed          .  gabapentin (NEURONTIN) 300 MG capsule  Take 300 mg by mouth 4 (four) times daily.           .  hydrOXYzine (VISTARIL) 25 MG capsule  Take 25 mg by mouth. Patient states that she is taking ( 2) 25 mg tablets at bedtime         .  Iron-Vitamins (GERITOL PO)  Take by mouth daily.           .  lansoprazole (PREVACID) 30 MG capsule  Take 1 capsule (30 mg total) by mouth daily.   30 capsule   11   .  LORazepam (ATIVAN) 1 MG tablet  Take 1 mg by mouth every 8 (eight) hours. Takes 1 by mouth 2 times a day         .  POTASSIUM CHLORIDE PO  Take 20 mEq by mouth. Takes 1 by mouth only when she takes the Lasix          .  Probiotic Product (ALIGN) 4 MG CAPS  Take by mouth daily.           .  ursodiol (ACTIGALL) 300 MG capsule  take 1 capsule by  mouth three times a day   270 capsule   3   .  vitamin C (ASCORBIC ACID) 500 MG tablet  Take 500 mg by mouth daily.           .  Zinc 50 MG TABS  Take by mouth daily.              Review of Systems   Constitutional: Negative for fever, chills and unexpected weight change.  HENT: Negative for hearing loss, congestion, sore throat, trouble swallowing and voice change.   Eyes: Negative for visual disturbance.  Respiratory: Negative for cough and wheezing.   Cardiovascular: Positive for chest pain. Negative for palpitations and leg swelling.  Gastrointestinal: Positive for abdominal pain and abdominal distention. Negative for nausea, vomiting, diarrhea, constipation, blood in stool and anal bleeding.  Genitourinary: Negative for hematuria, vaginal bleeding and difficulty urinating.  Musculoskeletal: Negative for arthralgias.  Skin: Negative for rash and wound.  Neurological: Negative for seizures, syncope and headaches.  Hematological: Negative for adenopathy. Does not bruise/bleed easily.  Psychiatric/Behavioral: Positive for confusion.  Blood pressure 146/84, pulse 102, temperature 97.8 F (36.6 C), temperature source Temporal, resp. rate 14, height 5\' 4"  (1.626 m), weight 159 lb (72.122 kg).   Physical Exam   Constitutional: She is oriented to person, place, and time. She appears well-developed and well-nourished. No distress.  HENT:   Head: Normocephalic and atraumatic.   Nose: Nose normal.   Mouth/Throat: No oropharyngeal exudate.  Eyes: Conjunctivae and EOM are normal. Pupils are equal, round, and reactive to light. Left eye exhibits no discharge. No scleral icterus.  Neck: Neck supple. No JVD present. No tracheal deviation present. No thyromegaly present.  Cardiovascular: Normal rate, regular rhythm, normal heart sounds and intact distal pulses.    No murmur heard. Pulmonary/Chest: Effort normal and breath sounds normal. No respiratory distress. She has no wheezes. She has  no rales. She exhibits no tenderness.  Abdominal: Soft. Bowel sounds are normal. She exhibits no distension and no mass. There is no tenderness. There is no rebound and no guarding.  Hernias  In lower midline.  Musculoskeletal: She exhibits no edema and no tenderness.  Lymphadenopathy:    She has no cervical adenopathy.  Neurological: She is alert and oriented to person, place, and time. She exhibits normal muscle tone. Coordination normal.  Skin: Skin is warm. No rash noted. She is not diaphoretic. No erythema. No pallor.  Psychiatric: She has a normal mood and affect. Her behavior is normal. Judgment and thought content normal.    Data Reviewed Dr. Patty Sermons notes on Epic, CT scans.   Assessment At least 2 ventral incisional hernias, lower midline. The lower one is above the symphysis pubis but only by a few centimeters. These are symptomatic and the patient strongly desires surgical repair.   Primary biliary cirrhosis, presumably secondary to chemotherapy for breast cancer   Portal hypertension   Grade 1 esophageal varices   Status post abdominal hysterectomy debris 2012 next status post left total mastectomy 2001   Plan   Will schedule for laparoscopic repair of ventral incisional hernias with mesh, possible open.   I explained to her that she is at increased risk for conversion to an open operation because of the varices and the proximity to the symphysis pubis. I told her that she is increased risk for postoperative delirium because of the combination of anesthesia and liver disease. I told her she was at increased risk for problems with wound healing and recurrence of the hernia. I discussed the indications, techniques, and the numerous risks of this operation with the patient and her daughter. All of her questions were answered. She understands these issues. She agrees with the plan for surgery.        Angelia Mould. Derrell Lolling, M.D., [facility] General and Minimally invasive Surgery Breast and Colorectal Surgery Office:   [phone] Pager:   [phone]

## 2011-10-23 ENCOUNTER — Encounter (HOSPITAL_COMMUNITY): Payer: Self-pay | Admitting: Vascular Surgery

## 2011-10-23 ENCOUNTER — Ambulatory Visit (HOSPITAL_COMMUNITY): Payer: Medicare Other | Admitting: Vascular Surgery

## 2011-10-23 ENCOUNTER — Encounter (HOSPITAL_COMMUNITY): Payer: Self-pay | Admitting: General Surgery

## 2011-10-23 ENCOUNTER — Encounter (HOSPITAL_COMMUNITY)
Admission: RE | Disposition: A | Discharge status: Home / Self Care | Payer: Self-pay | Source: Ambulatory Visit | Attending: General Surgery

## 2011-10-23 ENCOUNTER — Ambulatory Visit (HOSPITAL_COMMUNITY)
Admission: RE | Admit: 2011-10-23 | Discharge: 2011-10-26 | Disposition: A | Discharge status: Home / Self Care | Payer: Medicare Other | Source: Ambulatory Visit | Attending: General Surgery | Admitting: General Surgery

## 2011-10-23 DIAGNOSIS — I10 Essential (primary) hypertension: Secondary | ICD-10-CM | POA: Insufficient documentation

## 2011-10-23 DIAGNOSIS — K432 Incisional hernia without obstruction or gangrene: Secondary | ICD-10-CM | POA: Insufficient documentation

## 2011-10-23 DIAGNOSIS — F411 Generalized anxiety disorder: Secondary | ICD-10-CM | POA: Insufficient documentation

## 2011-10-23 DIAGNOSIS — K766 Portal hypertension: Secondary | ICD-10-CM | POA: Insufficient documentation

## 2011-10-23 DIAGNOSIS — K7689 Other specified diseases of liver: Secondary | ICD-10-CM | POA: Insufficient documentation

## 2011-10-23 DIAGNOSIS — F3289 Other specified depressive episodes: Secondary | ICD-10-CM | POA: Insufficient documentation

## 2011-10-23 DIAGNOSIS — Z01818 Encounter for other preprocedural examination: Secondary | ICD-10-CM | POA: Insufficient documentation

## 2011-10-23 DIAGNOSIS — E1142 Type 2 diabetes mellitus with diabetic polyneuropathy: Secondary | ICD-10-CM | POA: Insufficient documentation

## 2011-10-23 DIAGNOSIS — F329 Major depressive disorder, single episode, unspecified: Secondary | ICD-10-CM | POA: Insufficient documentation

## 2011-10-23 DIAGNOSIS — Z01810 Encounter for preprocedural cardiovascular examination: Secondary | ICD-10-CM | POA: Insufficient documentation

## 2011-10-23 DIAGNOSIS — K439 Ventral hernia without obstruction or gangrene: Secondary | ICD-10-CM | POA: Diagnosis present

## 2011-10-23 DIAGNOSIS — Z853 Personal history of malignant neoplasm of breast: Secondary | ICD-10-CM | POA: Insufficient documentation

## 2011-10-23 DIAGNOSIS — R51 Headache: Secondary | ICD-10-CM | POA: Insufficient documentation

## 2011-10-23 DIAGNOSIS — Z01812 Encounter for preprocedural laboratory examination: Secondary | ICD-10-CM | POA: Insufficient documentation

## 2011-10-23 DIAGNOSIS — D509 Iron deficiency anemia, unspecified: Secondary | ICD-10-CM | POA: Insufficient documentation

## 2011-10-23 DIAGNOSIS — K219 Gastro-esophageal reflux disease without esophagitis: Secondary | ICD-10-CM | POA: Insufficient documentation

## 2011-10-23 DIAGNOSIS — K745 Biliary cirrhosis, unspecified: Secondary | ICD-10-CM | POA: Insufficient documentation

## 2011-10-23 DIAGNOSIS — K743 Primary biliary cirrhosis: Secondary | ICD-10-CM

## 2011-10-23 DIAGNOSIS — E1149 Type 2 diabetes mellitus with other diabetic neurological complication: Secondary | ICD-10-CM | POA: Insufficient documentation

## 2011-10-23 HISTORY — DX: Shortness of breath: R06.02

## 2011-10-23 HISTORY — PX: VENTRAL HERNIA REPAIR: SHX424

## 2011-10-23 HISTORY — PX: HERNIA REPAIR: SHX51

## 2011-10-23 HISTORY — DX: Essential (primary) hypertension: I10

## 2011-10-23 LAB — GLUCOSE, CAPILLARY
Glucose-Capillary: 129 mg/dL — ABNORMAL HIGH (ref 70–99)
Glucose-Capillary: 163 mg/dL — ABNORMAL HIGH (ref 70–99)
Glucose-Capillary: 150 mg/dL — ABNORMAL HIGH (ref 70–99)
Glucose-Capillary: 129 mg/dL — ABNORMAL HIGH (ref 70–99)

## 2011-10-23 LAB — URINALYSIS, ROUTINE W REFLEX MICROSCOPIC
Nitrite: NEGATIVE
Specific Gravity, Urine: 1.015 (ref 1.005–1.030)
pH: 6.5 (ref 5.0–8.0)

## 2011-10-23 LAB — URINE MICROSCOPIC-ADD ON

## 2011-10-23 LAB — HEMOGLOBIN A1C: Hgb A1c MFr Bld: 5.4 % (ref <5.7)

## 2011-10-23 LAB — TYPE AND SCREEN: Antibody Screen: NEGATIVE

## 2011-10-23 SURGERY — REPAIR, HERNIA, VENTRAL, LAPAROSCOPIC
Anesthesia: General | Site: Abdomen | Wound class: Clean

## 2011-10-23 MED ORDER — PANTOPRAZOLE SODIUM 40 MG PO TBEC
40.0000 mg | DELAYED_RELEASE_TABLET | Freq: Every day | ORAL | Status: DC
  Administered 2011-10-24 – 2011-10-26 (×3): 40 mg via ORAL
  Filled 2011-10-23 (×3): qty 1

## 2011-10-23 MED ORDER — ONDANSETRON HCL 4 MG PO TABS: 4.0000 mg | ORAL_TABLET | Freq: Four times a day (QID) | ORAL | Status: DC | PRN

## 2011-10-23 MED ORDER — URSODIOL 300 MG PO CAPS
300.0000 mg | ORAL_CAPSULE | Freq: Three times a day (TID) | ORAL | Status: DC
  Administered 2011-10-23 – 2011-10-26 (×9): 300 mg via ORAL
  Filled 2011-10-23 (×11): qty 1

## 2011-10-23 MED ORDER — BIOTENE DRY MOUTH MT LIQD
15.0000 mL | Freq: Two times a day (BID) | OROMUCOSAL | Status: DC
  Administered 2011-10-23 – 2011-10-25 (×4): 15 mL via OROMUCOSAL

## 2011-10-23 MED ORDER — HYDROMORPHONE HCL PF 1 MG/ML IJ SOLN
INTRAMUSCULAR | Status: AC
  Filled 2011-10-23: qty 1

## 2011-10-23 MED ORDER — 0.9 % SODIUM CHLORIDE (POUR BTL) OPTIME
TOPICAL | Status: DC | PRN
  Administered 2011-10-23: 1000 mL

## 2011-10-23 MED ORDER — CHLORHEXIDINE GLUCONATE 4 % EX LIQD: 1.0000 "application " | Freq: Once | CUTANEOUS | Status: DC

## 2011-10-23 MED ORDER — FLUOXETINE HCL 20 MG PO CAPS
40.0000 mg | ORAL_CAPSULE | Freq: Every day | ORAL | Status: DC
  Administered 2011-10-24 – 2011-10-26 (×3): 40 mg via ORAL
  Filled 2011-10-23 (×3): qty 2

## 2011-10-23 MED ORDER — POTASSIUM CHLORIDE IN NACL 20-0.9 MEQ/L-% IV SOLN
INTRAVENOUS | Status: DC
  Administered 2011-10-23 – 2011-10-25 (×5): via INTRAVENOUS
  Filled 2011-10-23 (×6): qty 1000

## 2011-10-23 MED ORDER — ROCURONIUM BROMIDE 100 MG/10ML IV SOLN
INTRAVENOUS | Status: DC | PRN
  Administered 2011-10-23: 40 mg via INTRAVENOUS

## 2011-10-23 MED ORDER — PHENYLEPHRINE HCL 10 MG/ML IJ SOLN
INTRAMUSCULAR | Status: DC | PRN
  Administered 2011-10-23: 40 ug via INTRAVENOUS
  Administered 2011-10-23 (×3): 80 ug via INTRAVENOUS
  Administered 2011-10-23: 40 ug via INTRAVENOUS
  Administered 2011-10-23: 80 ug via INTRAVENOUS

## 2011-10-23 MED ORDER — ONDANSETRON HCL 4 MG/2ML IJ SOLN
4.0000 mg | Freq: Four times a day (QID) | INTRAMUSCULAR | Status: DC | PRN
  Administered 2011-10-23: 4 mg via INTRAVENOUS
  Filled 2011-10-23: qty 2

## 2011-10-23 MED ORDER — BUPIVACAINE-EPINEPHRINE 0.25% -1:200000 IJ SOLN
INTRAMUSCULAR | Status: DC | PRN
  Administered 2011-10-23: 10 mL

## 2011-10-23 MED ORDER — ONDANSETRON HCL 4 MG/2ML IJ SOLN
INTRAMUSCULAR | Status: DC | PRN
  Administered 2011-10-23: 4 mg via INTRAVENOUS

## 2011-10-23 MED ORDER — INSULIN ASPART 100 UNIT/ML ~~LOC~~ SOLN
0.0000 [IU] | Freq: Three times a day (TID) | SUBCUTANEOUS | Status: DC
  Administered 2011-10-23 – 2011-10-24 (×4): 3 [IU] via SUBCUTANEOUS
  Administered 2011-10-25: 4 [IU] via SUBCUTANEOUS
  Administered 2011-10-25 – 2011-10-26 (×3): 3 [IU] via SUBCUTANEOUS

## 2011-10-23 MED ORDER — DEXTROSE 5 % IV SOLN
INTRAVENOUS | Status: DC | PRN
  Administered 2011-10-23: 08:00:00 via INTRAVENOUS

## 2011-10-23 MED ORDER — URSODIOL 300 MG PO CAPS: 300.0000 mg | ORAL_CAPSULE | Freq: Three times a day (TID) | ORAL | Status: DC

## 2011-10-23 MED ORDER — LACTATED RINGERS IV SOLN
INTRAVENOUS | Status: DC | PRN
  Administered 2011-10-23 (×2): via INTRAVENOUS

## 2011-10-23 MED ORDER — GABAPENTIN 300 MG PO CAPS
300.0000 mg | ORAL_CAPSULE | Freq: Four times a day (QID) | ORAL | Status: DC
  Administered 2011-10-23 – 2011-10-26 (×12): 300 mg via ORAL
  Filled 2011-10-23 (×15): qty 1

## 2011-10-23 MED ORDER — CHLORHEXIDINE GLUCONATE 0.12 % MT SOLN
15.0000 mL | Freq: Two times a day (BID) | OROMUCOSAL | Status: DC
  Administered 2011-10-23 – 2011-10-25 (×4): 15 mL via OROMUCOSAL
  Filled 2011-10-23 (×4): qty 15

## 2011-10-23 MED ORDER — BUPIVACAINE-EPINEPHRINE PF 0.25-1:200000 % IJ SOLN
INTRAMUSCULAR | Status: AC
  Filled 2011-10-23: qty 30

## 2011-10-23 MED ORDER — LIDOCAINE HCL (CARDIAC) 20 MG/ML IV SOLN
INTRAVENOUS | Status: DC | PRN
  Administered 2011-10-23: 80 mg via INTRAVENOUS

## 2011-10-23 MED ORDER — GLYCOPYRROLATE 0.2 MG/ML IJ SOLN
INTRAMUSCULAR | Status: DC | PRN
  Administered 2011-10-23: 0.6 mg via INTRAVENOUS

## 2011-10-23 MED ORDER — CEFAZOLIN SODIUM-DEXTROSE 2-3 GM-% IV SOLR
2.0000 g | Freq: Three times a day (TID) | INTRAVENOUS | Status: AC
  Administered 2011-10-23 – 2011-10-24 (×3): 2 g via INTRAVENOUS
  Filled 2011-10-23 (×4): qty 50

## 2011-10-23 MED ORDER — MIDAZOLAM HCL 5 MG/5ML IJ SOLN
INTRAMUSCULAR | Status: DC | PRN
  Administered 2011-10-23: 1 mg via INTRAVENOUS

## 2011-10-23 MED ORDER — MORPHINE SULFATE 2 MG/ML IJ SOLN
2.0000 mg | INTRAMUSCULAR | Status: DC | PRN
  Administered 2011-10-23 – 2011-10-26 (×19): 2 mg via INTRAVENOUS
  Filled 2011-10-23 (×19): qty 1

## 2011-10-23 MED ORDER — PROPOFOL 10 MG/ML IV EMUL
INTRAVENOUS | Status: DC | PRN
  Administered 2011-10-23: 180 mg via INTRAVENOUS

## 2011-10-23 MED ORDER — AMLODIPINE BESYLATE 5 MG PO TABS
5.0000 mg | ORAL_TABLET | Freq: Every morning | ORAL | Status: DC
  Administered 2011-10-24 – 2011-10-26 (×3): 5 mg via ORAL
  Filled 2011-10-23 (×3): qty 1

## 2011-10-23 MED ORDER — METFORMIN HCL 500 MG PO TABS
250.0000 mg | ORAL_TABLET | Freq: Every day | ORAL | Status: DC
  Administered 2011-10-23 – 2011-10-25 (×3): 250 mg via ORAL
  Filled 2011-10-23 (×4): qty 1

## 2011-10-23 MED ORDER — FENTANYL CITRATE 0.05 MG/ML IJ SOLN
INTRAMUSCULAR | Status: DC | PRN
  Administered 2011-10-23 (×2): 50 ug via INTRAVENOUS

## 2011-10-23 MED ORDER — LORAZEPAM 1 MG PO TABS
1.0000 mg | ORAL_TABLET | Freq: Three times a day (TID) | ORAL | Status: DC
  Administered 2011-10-23 – 2011-10-26 (×9): 1 mg via ORAL
  Filled 2011-10-23 (×10): qty 1

## 2011-10-23 MED ORDER — SODIUM CHLORIDE 0.9 % IR SOLN
Status: DC | PRN
  Administered 2011-10-23: 08:00:00

## 2011-10-23 MED ORDER — NEOSTIGMINE METHYLSULFATE 1 MG/ML IJ SOLN
INTRAMUSCULAR | Status: DC | PRN
  Administered 2011-10-23: 4 mg via INTRAVENOUS

## 2011-10-23 MED ORDER — HYDROMORPHONE HCL PF 1 MG/ML IJ SOLN
0.2500 mg | INTRAMUSCULAR | Status: DC | PRN
  Administered 2011-10-23 (×2): 0.5 mg via INTRAVENOUS

## 2011-10-23 SURGICAL SUPPLY — 80 items
APL SKNCLS STERI-STRIP NONHPOA (GAUZE/BANDAGES/DRESSINGS)
APPLIER CLIP LOGIC TI 5 (MISCELLANEOUS) IMPLANT
APR CLP MED LRG 33X5 (MISCELLANEOUS)
BAG DECANTER FOR FLEXI CONT (MISCELLANEOUS) ×2 IMPLANT
BENZOIN TINCTURE PRP APPL 2/3 (GAUZE/BANDAGES/DRESSINGS) ×1 IMPLANT
BINDER ABD UNIV 12 45-62 (WOUND CARE) ×1 IMPLANT
BINDER ABDOMINAL 46IN 62IN (WOUND CARE) ×3
BLADE SURG ROTATE 9660 (MISCELLANEOUS) IMPLANT
CANISTER SUCTION 2500CC (MISCELLANEOUS) ×3 IMPLANT
CHLORAPREP W/TINT 26ML (MISCELLANEOUS) ×3 IMPLANT
CLOTH BEACON ORANGE TIMEOUT ST (SAFETY) ×3 IMPLANT
COVER SURGICAL LIGHT HANDLE (MISCELLANEOUS) ×3 IMPLANT
DECANTER SPIKE VIAL GLASS SM (MISCELLANEOUS) ×1 IMPLANT
DEVICE SECURE STRAP 25 ABSORB (INSTRUMENTS) ×1 IMPLANT
DEVICE TROCAR PUNCTURE CLOSURE (ENDOMECHANICALS) ×3 IMPLANT
DRAPE LAPAROSCOPIC ABDOMINAL (DRAPES) ×3 IMPLANT
DRAPE UTILITY 15X26 W/TAPE STR (DRAPE) ×6 IMPLANT
DRSG PAD ABDOMINAL 8X10 ST (GAUZE/BANDAGES/DRESSINGS) ×8 IMPLANT
DRSG TEGADERM 4X4.75 (GAUZE/BANDAGES/DRESSINGS) ×10 IMPLANT
ELECT CAUTERY BLADE 6.4 (BLADE) ×3 IMPLANT
ELECT REM PT RETURN 9FT ADLT (ELECTROSURGICAL) ×3
ELECTRODE REM PT RTRN 9FT ADLT (ELECTROSURGICAL) ×2 IMPLANT
FILTER SMOKE EVAC LAPAROSHD (FILTER) ×2 IMPLANT
GAUZE SPONGE 2X2 8PLY STRL LF (GAUZE/BANDAGES/DRESSINGS) ×2 IMPLANT
GLOVE BIO SURGEON STRL SZ 6 (GLOVE) ×2 IMPLANT
GLOVE BIO SURGEON STRL SZ7.5 (GLOVE) ×2 IMPLANT
GLOVE BIOGEL PI IND STRL 6.5 (GLOVE) ×3 IMPLANT
GLOVE BIOGEL PI IND STRL 7.0 (GLOVE) ×3 IMPLANT
GLOVE BIOGEL PI IND STRL 7.5 (GLOVE) ×1 IMPLANT
GLOVE BIOGEL PI IND STRL 8 (GLOVE) ×1 IMPLANT
GLOVE BIOGEL PI INDICATOR 6.5 (GLOVE) ×3
GLOVE BIOGEL PI INDICATOR 7.0 (GLOVE) ×3
GLOVE BIOGEL PI INDICATOR 7.5 (GLOVE) ×1
GLOVE BIOGEL PI INDICATOR 8 (GLOVE) ×1
GLOVE ECLIPSE 6.0 STRL STRAW (GLOVE) ×4 IMPLANT
GLOVE ECLIPSE 8.0 STRL XLNG CF (GLOVE) ×2 IMPLANT
GLOVE EUDERMIC 7 POWDERFREE (GLOVE) ×3 IMPLANT
GLOVE SURG SS PI 7.0 STRL IVOR (GLOVE) ×6 IMPLANT
GOWN PREVENTION PLUS XLARGE (GOWN DISPOSABLE) ×3 IMPLANT
GOWN STRL NON-REIN LRG LVL3 (GOWN DISPOSABLE) ×16 IMPLANT
KIT BASIN OR (CUSTOM PROCEDURE TRAY) ×3 IMPLANT
KIT ROOM TURNOVER OR (KITS) ×3 IMPLANT
MESH PARIETEX 25X20 (Mesh General) ×2 IMPLANT
NDL HYPO 25GX1X1/2 BEV (NEEDLE) ×1 IMPLANT
NDL SPNL 22GX3.5 QUINCKE BK (NEEDLE) ×1 IMPLANT
NEEDLE HYPO 25GX1X1/2 BEV (NEEDLE) ×3 IMPLANT
NEEDLE SPNL 22GX3.5 QUINCKE BK (NEEDLE) ×3 IMPLANT
NS IRRIG 1000ML POUR BTL (IV SOLUTION) ×3 IMPLANT
PACK GENERAL/GYN (CUSTOM PROCEDURE TRAY) ×3 IMPLANT
PAD ARMBOARD 7.5X6 YLW CONV (MISCELLANEOUS) ×6 IMPLANT
PEN SKIN MARKING BROAD (MISCELLANEOUS) ×5 IMPLANT
SCALPEL HARMONIC ACE (MISCELLANEOUS) IMPLANT
SCISSORS LAP 5X35 DISP (ENDOMECHANICALS) IMPLANT
SET IRRIG TUBING LAPAROSCOPIC (IRRIGATION / IRRIGATOR) ×3 IMPLANT
SLEEVE ENDOPATH XCEL 5M (ENDOMECHANICALS) ×8 IMPLANT
SPONGE GAUZE 2X2 STER 10/PKG (GAUZE/BANDAGES/DRESSINGS) ×2
SPONGE GAUZE 4X4 12PLY (GAUZE/BANDAGES/DRESSINGS) IMPLANT
STAPLER VISISTAT 35W (STAPLE) ×3 IMPLANT
SUT MON AB 4-0 PC3 18 (SUTURE) IMPLANT
SUT NOVA 1 T20/GS 25DT (SUTURE) ×4 IMPLANT
SUT NOVA NAB GS-21 0 18 T12 DT (SUTURE) ×6 IMPLANT
SUT NOVA NAB GS-21 1 T12 (SUTURE) IMPLANT
SUT NOVAFIL 0 T 12 (SUTURE) ×6 IMPLANT
SUT PDS AB 1 CTX 36 (SUTURE) IMPLANT
SUT PROLENE 0 CT 1 CR/8 (SUTURE) IMPLANT
SUT VIC AB 2-0 CT1 27 (SUTURE)
SUT VIC AB 2-0 CT1 TAPERPNT 27 (SUTURE) IMPLANT
SUT VIC AB 3-0 CT1 27 (SUTURE)
SUT VIC AB 3-0 CT1 TAPERPNT 27 (SUTURE) IMPLANT
SUT VICRYL 0 TIES 12 18 (SUTURE) IMPLANT
SYR CONTROL 10ML LL (SYRINGE) ×3 IMPLANT
TACKER 5MM HERNIA 3.5CML NAB (ENDOMECHANICALS) ×4 IMPLANT
TAPE CLOTH SURG 6X10 WHT LF (GAUZE/BANDAGES/DRESSINGS) ×2 IMPLANT
TOWEL OR 17X24 6PK STRL BLUE (TOWEL DISPOSABLE) ×3 IMPLANT
TOWEL OR 17X26 10 PK STRL BLUE (TOWEL DISPOSABLE) ×3 IMPLANT
TRAY FOLEY CATH 14FRSI W/METER (CATHETERS) ×3 IMPLANT
TRAY LAPAROSCOPIC (CUSTOM PROCEDURE TRAY) ×3 IMPLANT
TROCAR XCEL NON-BLD 11X100MML (ENDOMECHANICALS) ×3 IMPLANT
TROCAR XCEL NON-BLD 5MMX100MML (ENDOMECHANICALS) ×3 IMPLANT
WATER STERILE IRR 1000ML POUR (IV SOLUTION) IMPLANT

## 2011-10-23 NOTE — Op Note (Signed)
Patient Name:           Ann Ward   Date of Surgery:        10/23/2011  Pre op Diagnosis:      Multiple ventral incisional hernias  Post op Diagnosis:    same  Procedure:                 Laparoscopic repair of multiple ventral incisional hernias with 20 x 25 cm Parietex composite mesh  Surgeon:                     Angelia Mould. Derrell Lolling, M.D., FACS  Assistant:                      Dr. Avel Peace  Operative Indications:   This is a 66 year old Caucasian female with a history of primary biliary cirrhosis, hepatosplenomegaly, early portal hypertension, thought to be due to chemotherapy for left breast cancer. She has type 2 diabetes and iron deficiency anemia. She has severe anxiety and depression, diabetic neuropathy and hypertension. 1-1/2 years ago she had an abdominal hysterectomy at [facility] in [place] through a lower midline incision for nonmalignant mass of the ovaries. She developed painful, but reducible hernias in the lower midline. On exam she has 2 reducible defects in the lower midline, the lowest being about 4 or 5 cm above the symphysis pubis. She has undergone extensive medical and GI evaluation. She is brought to the operating room electively for repair of her ventral hernias  Operative Findings:       The patient had 2 incisional hernia defects in the lower midline. These were probably about 5 cm in diameter each. There were minimal adhesions. I had 5 cm of healthy abdominal wall muscle and fascia inferior to the lower rim of the lowest hernia. We used a 20 cm x 25 cm piece of Parietex composite mesh. Her liver was significantly enlarged and macronodular. Her spleen was palpably enlarged but obscured by omentum and chronic adhesions. The small intestine and large intestine were grossly normal to inspection.  Procedure in Detail:          Following the induction of general endotracheal anesthesia a Foley catheter was inserted, intravenous antibiotics were given,  the abdomen was prepped and draped in a sterile fashion and a surgical time out was performed. 0.5% Marcaine with epinephrine was used as a local infiltration anesthetic. The edges of the liver and spleen were marked with a marking pen. Abdominal entry was in the left upper quadrant with a 5 mm optical trocar. This was between the liver and spleen and trocar entry was smooth and without apparent injury or trauma. Pneumoperitoneum was created. I placed a 5 mm trocar left lower quadrant, an 11 mm trocar in the left flank, and two5 mm trocars in the right lateral abdomen. The patient was positioned. We took down the peritoneum and preperitoneal tissues inferior to the lower edge of the lowest defect and took the peritoneum down as a flap to the level of the symphysis pubis and pubic rami. We then mapped out the hernia defects and determined that we could  get a 5 or 6 cm overlap it we used a 20 x 25 cm piece of mesh. We brought a 25 x 20 cm piece of Parietex  Composite mesh to the operative field. I drew a template on the abdominal wall for 8 suture fixation sites. I placed 8 interrupted sutures of #1  Novofil in the mesh, being careful to orient the rough side of the mesh up to the abdominal wall and the smooth side of the  mesh down toward the abdominal viscera. After this was done the mesh was moistened in antibiotic irrigation fluid, rolled up and inserted. The mesh was positioned according to the template. I made 8 puncture wounds and under direct vision drew  the Novofil sutures up through the puncture wounds, being careful to take a 1 cm bite of fascia. After this was done we reduced the pneumoperitoneum and tied all 8 sutures. This looked good. We then secured the mesh with 5 mm PrpTacker device, using a double crown technique. I probably placed 75 or 80 tacks to get this completely tacked down. This looked good in all areas with particular attention being placed to the inferior aspect. There was no bleeding at  the completion of the mesh fixation. The pneumoperitoneum was released. The trocars were removed. Skin incisions were closed with skin staples. We  Placed a Velcro.  The patient tolerated the procedure well without any apparent complications. She was taken to the recovery room in stable condition. EBL 25 cc. Counts correct. Complications none.     Angelia Mould. Derrell Lolling, M.D., FACS General and Minimally Invasive Surgery Breast and Colorectal Surgery  10/23/2011 9:37 AM

## 2011-10-23 NOTE — Transfer of Care (Signed)
Immediate Anesthesia Transfer of Care Note  Patient: Ann Ward  Procedure(s) Performed: Procedure(s) (LRB): LAPAROSCOPIC VENTRAL HERNIA (N/A) INSERTION OF MESH (N/A)  Patient Location: PACU  Anesthesia Type: General  Level of Consciousness: awake, sedated and patient cooperative  Airway & Oxygen Therapy: Patient Spontanous Breathing and Patient connected to nasal cannula oxygen  Post-op Assessment: Report given to PACU RN, Post -op Vital signs reviewed and stable and Patient moving all extremities  Post vital signs: Reviewed and stable  Complications: No apparent anesthesia complications

## 2011-10-23 NOTE — Anesthesia Postprocedure Evaluation (Signed)
  Anesthesia Post-op Note  Patient: Ann Ward  Procedure(s) Performed: Procedure(s) (LRB): LAPAROSCOPIC VENTRAL HERNIA (N/A) INSERTION OF MESH (N/A)  Patient Location: PACU  Anesthesia Type: General  Level of Consciousness: awake  Airway and Oxygen Therapy: Patient Spontanous Breathing  Post-op Pain: mild  Post-op Assessment: Post-op Vital signs reviewed  Post-op Vital Signs: Reviewed  Complications: No apparent anesthesia complications

## 2011-10-23 NOTE — Anesthesia Preprocedure Evaluation (Addendum)
Anesthesia Evaluation  Patient identified by MRN, date of birth, ID band Patient awake    Reviewed: Allergy & Precautions, H&P , NPO status , Patient's Chart, lab work & pertinent test results, reviewed documented beta blocker date and time   Airway Mallampati: II TM Distance: >3 FB Neck ROM: Full    Dental  (+) Edentulous Upper, Partial Lower and Poor Dentition   Pulmonary          Cardiovascular     Neuro/Psych  Headaches, Anxiety Depression    GI/Hepatic GERD-  Controlled and Medicated,(+) Cirrhosis -       ,   Endo/Other  Well Controlled, Type 2, Oral Hypoglycemic Agents  Renal/GU      Musculoskeletal   Abdominal   Peds  Hematology   Anesthesia Other Findings Lower right tooth broken at gum  Reproductive/Obstetrics                        Anesthesia Physical Anesthesia Plan  ASA: III  Anesthesia Plan: General   Post-op Pain Management:    Induction: Intravenous  Airway Management Planned: Oral ETT  Additional Equipment:   Intra-op Plan:   Post-operative Plan: Extubation in OR  Informed Consent:   Dental advisory given  Plan Discussed with: CRNA, Surgeon and Anesthesiologist  Anesthesia Plan Comments:        Anesthesia Quick Evaluation

## 2011-10-23 NOTE — Preoperative (Signed)
Beta Blockers   Reason not to administer Beta Blockers:Not Applicable 

## 2011-10-23 NOTE — Anesthesia Procedure Notes (Signed)
Procedure Name: Intubation Date/Time: 10/23/2011 7:45 AM Performed by: Darcey Nora B Pre-anesthesia Checklist: Patient identified, Emergency Drugs available, Suction available and Patient being monitored Patient Re-evaluated:Patient Re-evaluated prior to inductionOxygen Delivery Method: Circle system utilized Preoxygenation: Pre-oxygenation with 100% oxygen Intubation Type: IV induction Ventilation: Mask ventilation without difficulty Laryngoscope Size: Mac and 3 Grade View: Grade I Tube type: Oral Tube size: 7.5 mm Number of attempts: 1 Airway Equipment and Method: Stylet Placement Confirmation: ETT inserted through vocal cords under direct vision and breath sounds checked- equal and bilateral Secured at: 21 (cm at gum) cm Tube secured with: Tape Dental Injury: Teeth and Oropharynx as per pre-operative assessment

## 2011-10-23 NOTE — Interval H&P Note (Signed)
History and Physical Interval Note:  10/23/2011 7:28 AM  Ann Ward  has presented today for surgery, with the diagnosis of ventral hernia  The goals of treatment and the various methods of treatment have been discussed with the patient and family. After consideration of risks, benefits and other options for treatment, the patient has consented to  Procedure(s) (LRB): LAPAROSCOPIC VENTRAL HERNIA (N/A),  HERNIA REPAIR VENTRAL ADULT (N/A), POSSIBLE OPEN, INSERTION OF MESH (N/A) as a surgical intervention .  The patient's history has been reviewed and the patient examined today , no change in status, stable for surgery.  I have reviewed the patient's chart and labs.  Questions were answered to the patient's satisfaction.     Ernestene Mention

## 2011-10-24 LAB — CBC
MCV: 85.4 fL (ref 78.0–100.0)
Platelets: 80 10*3/uL — ABNORMAL LOW (ref 150–400)
RBC: 3.91 MIL/uL (ref 3.87–5.11)
RDW: 15.5 % (ref 11.5–15.5)
WBC: 5.1 10*3/uL (ref 4.0–10.5)

## 2011-10-24 LAB — BASIC METABOLIC PANEL
CO2: 30 mEq/L (ref 19–32)
Calcium: 8.5 mg/dL (ref 8.4–10.5)
Chloride: 101 mEq/L (ref 96–112)
Creatinine, Ser: 0.67 mg/dL (ref 0.50–1.10)
GFR calc Af Amer: >90 mL/min (ref >90)
Sodium: 138 mEq/L (ref 135–145)

## 2011-10-24 LAB — GLUCOSE, CAPILLARY
Glucose-Capillary: 114 mg/dL — ABNORMAL HIGH (ref 70–99)
Glucose-Capillary: 94 mg/dL (ref 70–99)

## 2011-10-24 MED ORDER — OXYCODONE HCL 5 MG PO TABS
5.0000 mg | ORAL_TABLET | ORAL | Status: DC | PRN
  Administered 2011-10-26: 5 mg via ORAL
  Filled 2011-10-24 (×2): qty 1

## 2011-10-24 NOTE — Progress Notes (Signed)
1 Day Post-Op  Subjective: Stable, alert, pain control seems fairly good. Nauseated less blood but that has resolved. Foley catheter still in, to be removed this morning. Lab work drawn but results not back yet. Good urine output.  Objective: Vital signs in last 24 hours: Temp:  [97 F (36.1 C)-99 F (37.2 C)] 98.2 F (36.8 C) (07/27 0535) Pulse Rate:  [96-112] 102  (07/27 0535) Resp:  [16-20] 16  (07/27 0535) BP: (134-158)/(76-99) 135/77 mmHg (07/27 0535) SpO2:  [92 %-97 %] 94 % (07/27 0535) Weight:  [160 lb 7.9 oz (72.8 kg)] 160 lb 7.9 oz (72.8 kg) (07/26 1108)    Intake/Output from previous day: 07/26 0701 - 07/27 0700 In: 2110 [P.O.:360; I.V.:1750] Out: 4100 [Urine:4100] Intake/Output this shift: Total I/O In: -  Out: 2200 [Urine:2200]  General appearance: alert. Pleasant. In no obvious distress. Mental status normal, at baseline. Interactive. Resp: clear to auscultation bilaterally GI: abdomen soft, protuberant, liver and spleen palpably enlarged. Wounds looked fine. Minimal bowel sounds.  Lab Results:  Results for orders placed during the hospital encounter of 10/23/11 (from the past 24 hour(s))  URINALYSIS, ROUTINE W REFLEX MICROSCOPIC     Status: Abnormal   Collection Time   10/23/11  6:46 AM      Component Value Range   Color, Urine YELLOW  YELLOW   APPearance CLEAR  CLEAR   Specific Gravity, Urine 1.015  1.005 - 1.030   pH 6.5  5.0 - 8.0   Glucose, UA NEGATIVE  NEGATIVE mg/dL   Hgb urine dipstick NEGATIVE  NEGATIVE   Bilirubin Urine NEGATIVE  NEGATIVE   Ketones, ur NEGATIVE  NEGATIVE mg/dL   Protein, ur NEGATIVE  NEGATIVE mg/dL   Urobilinogen, UA 1.0  0.0 - 1.0 mg/dL   Nitrite NEGATIVE  NEGATIVE   Leukocytes, UA TRACE (*) NEGATIVE  URINE MICROSCOPIC-ADD ON     Status: Abnormal   Collection Time   10/23/11  6:46 AM      Component Value Range   Squamous Epithelial / LPF RARE  RARE   WBC, UA 0-2  <3 WBC/hpf   RBC / HPF 0-2  <3 RBC/hpf   Casts HYALINE CASTS  (*) NEGATIVE  GLUCOSE, CAPILLARY     Status: Abnormal   Collection Time   10/23/11  6:57 AM      Component Value Range   Glucose-Capillary 134 (*) 70 - 99 mg/dL  TYPE AND SCREEN     Status: Normal   Collection Time   10/23/11  8:00 AM      Component Value Range   ABO/RH(D) B POS     Antibody Screen NEG     Sample Expiration 10/26/2011    ABO/RH     Status: Normal   Collection Time   10/23/11  8:00 AM      Component Value Range   ABO/RH(D) B POS    GLUCOSE, CAPILLARY     Status: Abnormal   Collection Time   10/23/11 10:08 AM      Component Value Range   Glucose-Capillary 129 (*) 70 - 99 mg/dL  GLUCOSE, CAPILLARY     Status: Abnormal   Collection Time   10/23/11 11:19 AM      Component Value Range   Glucose-Capillary 163 (*) 70 - 99 mg/dL  HEMOGLOBIN Z6X     Status: Normal   Collection Time   10/23/11 11:27 AM      Component Value Range   Hemoglobin A1C 5.4  <5.7 %  Mean Plasma Glucose 108  <117 mg/dL  GLUCOSE, CAPILLARY     Status: Abnormal   Collection Time   10/23/11 12:14 PM      Component Value Range   Glucose-Capillary 150 (*) 70 - 99 mg/dL  GLUCOSE, CAPILLARY     Status: Abnormal   Collection Time   10/23/11  5:13 PM      Component Value Range   Glucose-Capillary 127 (*) 70 - 99 mg/dL  GLUCOSE, CAPILLARY     Status: Abnormal   Collection Time   10/23/11  9:35 PM      Component Value Range   Glucose-Capillary 129 (*) 70 - 99 mg/dL  CBC     Status: Abnormal   Collection Time   10/24/11  5:55 AM      Component Value Range   WBC 5.1  4.0 - 10.5 K/uL   RBC 3.91  3.87 - 5.11 MIL/uL   Hemoglobin 10.8 (*) 12.0 - 15.0 g/dL   HCT 30.8 (*) 65.7 - 84.6 %   MCV 85.4  78.0 - 100.0 fL   MCH 27.6  26.0 - 34.0 pg   MCHC 32.3  30.0 - 36.0 g/dL   RDW 96.2  95.2 - 84.1 %   Platelets 80 (*) 150 - 400 K/uL     Studies/Results: @RISRSLT24 @     . amLODipine  5 mg Oral q morning - 10a  . antiseptic oral rinse  15 mL Mouth Rinse q12n4p  .  ceFAZolin (ANCEF) IV  2 g  Intravenous 60 min Pre-Op  .  ceFAZolin (ANCEF) IV  2 g Intravenous Q8H  . chlorhexidine  15 mL Mouth Rinse BID  . FLUoxetine  40 mg Oral Daily  . gabapentin  300 mg Oral QID  . HYDROmorphone      . insulin aspart  0-20 Units Subcutaneous TID WC  . LORazepam  1 mg Oral TID  . metFORMIN  250 mg Oral QAC lunch  . pantoprazole  40 mg Oral QAC breakfast  . ursodiol  300 mg Oral TID  . DISCONTD: chlorhexidine  1 application Topical Once  . DISCONTD: ursodiol  300 mg Oral TID     Assessment/Plan: s/p Procedure(s): LAPAROSCOPIC VENTRAL HERNIA INSERTION OF MESH  POD #1. Stable. Discontinue Foley. She may have an ileus, will have to wait and see. Allow full liquids. Decrease IV rate. Check lab work this morning. Will also check b-met tomorrow due to history of hyponatremia.  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 NO Tylenol.  Type 2 diabetes mellitus.CBGs in 127-163 range. Acceptable.  Hypertension.controlled  Severe anxiety and depression. Continue Ativan    LOS: 1 day    Pranit Owensby M. Derrell Lolling, M.D., [facility] General and Minimally invasive Surgery Breast and Colorectal Surgery Office:   [phone] Pager:   [phone]  10/24/2011  . .prob

## 2011-10-25 LAB — GLUCOSE, CAPILLARY
Glucose-Capillary: 142 mg/dL — ABNORMAL HIGH (ref 70–99)
Glucose-Capillary: 126 mg/dL — ABNORMAL HIGH (ref 70–99)

## 2011-10-25 LAB — BASIC METABOLIC PANEL
Chloride: 101 mEq/L (ref 96–112)
Creatinine, Ser: 0.66 mg/dL (ref 0.50–1.10)
GFR calc Af Amer: >90 mL/min (ref >90)
Potassium: 4 mEq/L (ref 3.5–5.1)

## 2011-10-25 MED ORDER — POLYETHYLENE GLYCOL 3350 17 G PO PACK
17.0000 g | PACK | Freq: Every day | ORAL | Status: DC
  Administered 2011-10-25 – 2011-10-26 (×2): 17 g via ORAL
  Filled 2011-10-25 (×2): qty 1

## 2011-10-25 NOTE — Progress Notes (Signed)
2 Days Post-Op  Subjective: Alert. In no distress. Ambulating in room. Voiding large amounts according to patient. No bowel movement. Tolerating full liquid diet. Hungry. Denies nausea.  Objective: Vital signs in last 24 hours: Temp:  [98 F (36.7 C)-99.8 F (37.7 C)] 98 F (36.7 C) (07/28 0526) Pulse Rate:  [97-110] 97  (07/28 0526) Resp:  [18-19] 18  (07/28 0526) BP: (122-140)/(68-74) 140/73 mmHg (07/28 0526) SpO2:  [94 %-97 %] 97 % (07/28 0526)    Intake/Output from previous day: 07/27 0701 - 07/28 0700 In: 1300 [P.O.:360; I.V.:940] Out: 1275 [Urine:1275] Intake/Output this shift:    General appearance: alert. Mental status normal. In no distress. GI: abdomen soft. Obese. Bowel sounds present. Wounds looked fine. Appropriately tender.  Lab Results:  Results for orders placed during the hospital encounter of 10/23/11 (from the past 24 hour(s))  GLUCOSE, CAPILLARY     Status: Abnormal   Collection Time   10/24/11 11:43 AM      Component Value Range   Glucose-Capillary 114 (*) 70 - 99 mg/dL   Comment 1 Notify RN    GLUCOSE, CAPILLARY     Status: Abnormal   Collection Time   10/24/11  5:28 PM      Component Value Range   Glucose-Capillary 142 (*) 70 - 99 mg/dL   Comment 1 Notify RN    GLUCOSE, CAPILLARY     Status: Normal   Collection Time   10/24/11 10:22 PM      Component Value Range   Glucose-Capillary 94  70 - 99 mg/dL   Comment 1 Documented in Chart     Comment 2 Notify RN    BASIC METABOLIC PANEL     Status: Abnormal   Collection Time   10/25/11  4:49 AM      Component Value Range   Sodium 138  135 - 145 mEq/L   Potassium 4.0  3.5 - 5.1 mEq/L   Chloride 101  96 - 112 mEq/L   CO2 31  19 - 32 mEq/L   Glucose, Bld 118 (*) 70 - 99 mg/dL   BUN 8  6 - 23 mg/dL   Creatinine, Ser 1.61  0.50 - 1.10 mg/dL   Calcium 8.7  8.4 - 09.6 mg/dL   GFR calc non Af Amer 89 (*) >90 mL/min   GFR calc Af Amer >90  >90 mL/min  GLUCOSE, CAPILLARY     Status: Abnormal   Collection  Time   10/25/11  7:39 AM      Component Value Range   Glucose-Capillary 132 (*) 70 - 99 mg/dL   Comment 1 Documented in Chart     Comment 2 Notify RN       Studies/Results: @RISRSLT24 @     . amLODipine  5 mg Oral q morning - 10a  . antiseptic oral rinse  15 mL Mouth Rinse q12n4p  . chlorhexidine  15 mL Mouth Rinse BID  . FLUoxetine  40 mg Oral Daily  . gabapentin  300 mg Oral QID  . insulin aspart  0-20 Units Subcutaneous TID WC  . LORazepam  1 mg Oral TID  . metFORMIN  250 mg Oral QAC lunch  . pantoprazole  40 mg Oral QAC breakfast  . polyethylene glycol  17 g Oral Daily  . ursodiol  300 mg Oral TID     Assessment/Plan: s/p Procedure(s): LAPAROSCOPIC VENTRAL HERNIA INSERTION OF MESH  POD #2. Stable.   advance to low-fat diet. Offer MiraLAX.[name]  Decrease IV rate.  Hopefully discharge home tomorrow.   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 NO Tylenol.   Type 2 diabetes mellitus.CBGs in 94-142  range. Acceptable.  Hypertension.controlled   Severe anxiety and depression. Continue Ativan and Prozac.     LOS: 2 days    Ann Ward M 10/25/2011  . .prob

## 2011-10-26 ENCOUNTER — Telehealth (INDEPENDENT_AMBULATORY_CARE_PROVIDER_SITE_OTHER): Payer: Self-pay | Admitting: General Surgery

## 2011-10-26 ENCOUNTER — Encounter (HOSPITAL_COMMUNITY): Payer: Self-pay | Admitting: General Surgery

## 2011-10-26 MED ORDER — POLYETHYLENE GLYCOL 3350 17 G PO PACK: 17.0000 g | PACK | Freq: Every day | ORAL | Status: DC

## 2011-10-26 MED ORDER — HYDROCODONE-ACETAMINOPHEN 5-325 MG PO TABS: 1.0000 | ORAL_TABLET | Freq: Four times a day (QID) | ORAL | Status: AC | PRN

## 2011-10-26 NOTE — Discharge Summary (Signed)
Patient ID: Ann Ward [id number] 66 y.o. 05-Dec-1945  10/23/2011  Discharge date and time: October 26, 2011  Admitting Physician: Ernestene Mention  Discharge Physician: Ernestene Mention  Admission Diagnoses: ventral hernia  Discharge Diagnoses: ventral incisional hernia Biliary cirrhosis and hepatosplenomegaly Hypertension Type 2 diabetes mellitus Severe anxiety and depression  Operations: Procedure(s): LAPAROSCOPIC VENTRAL HERNIA INSERTION OF MESH  Admission Condition: good  Discharged Condition: good  Indication for Admission: [name] is a 66 y.o. female. She wasreferred by Dr. Lionel December in [facility] for evaluation of ventral incisional hernias. Her primary care physician is Dr. Kirstie Peri.  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 She has borderline diabetes. She has iron deficiency anemia. She had a left total mastectomy in 2004 by Dr. Josephine Cables for stage II breast cancer. She had adjuvant radiation therapy to the chest wall and chemotherapy. Intra-abdominal surgery she had a tubal ligation many years ago. One year ago at she had an abdominal hysterectomy at [facility] in [place] through a lower midline incision. She states she had a large, nonmalignant mass of either the uterus or ovaries.  She is known to have an enlarged liver and spleen due to her primary biliary cirrhosis.  She now presents with a six-month history of lumps in her abdomen. They do not cause any pain but they are getting larger. She is afraid of what will happen over time.  Last upper endoscopy and colonoscopy was one year ago which showed some gastritis but no malignancy.  On exam she has 2 reducible ventral hernias in the lower midline below the umbilicus. One of these is in the suprapubic area. CT scan demonstrates these hernias she is brought to the operating room electively for  repair of these hernias.   Hospital Course: on the day of admission the patient was taken operating room and underwent laparoscopic repair of her ventral incisional hernias with mesh. The surgery was uneventful. The liver and spleen were noted to be significantly enlarged. There were minimal adhesions and we were able to secure the mesh widely around the defects. Postoperatively the patient did fairly well. She slowly advanced her diet and activities. By the day of discharge she was tolerating a regular diet, passing lots of flatus, and wanted to go home. Her abdomen was soft and the wounds were healing uneventfully.  She was given instructions in diet, activities, and wound care. She was given a prescription for hydrocodone for pain. She was told she has staples in the wound and advised to make an appointment to see me in 5-8 days to have the staples removed. I made no changes to her home medications.  Consults: None  Significant Diagnostic Studies: none  Treatments: surgery: laparoscopic repair of multiple ventral incisional hernias.  Disposition: Home  Patient Instructions:   Julia, Alkhatib  Home Medication Instructions [id number]   Printed on:10/26/11 9562  Medication Information                    ursodiol (ACTIGALL) 300 MG capsule take 1 capsule by mouth three times a day           gabapentin (NEURONTIN) 300 MG capsule Take 300 mg by mouth 4 (four) times daily.             b complex vitamins capsule Take 1 capsule by mouth daily.  amLODipine (NORVASC) 5 MG tablet Take 5 mg by mouth every morning.           fexofenadine (ALLEGRA) 60 MG tablet Take 60 mg by mouth daily.           ursodiol (ACTIGALL) 300 MG capsule Take 300 mg by mouth 3 (three) times daily.           LORazepam (ATIVAN) 1 MG tablet Take 1 mg by mouth 3 (three) times daily.           potassium chloride SA (K-DUR,KLOR-CON) 20 MEQ tablet Take 20 mEq by mouth daily as needed. Take for  swelling along with Lasix.           FLUoxetine (PROZAC) 20 MG capsule Take 40 mg by mouth daily.           ferrous fumarate (HEMOCYTE - 106 MG FE) 325 (106 FE) MG TABS Take 1 tablet by mouth daily.           calcium-vitamin D (OSCAL WITH D) 500-200 MG-UNIT per tablet Take 1 tablet by mouth 2 (two) times daily.           bifidobacterium infantis (ALIGN) capsule Take 1 capsule by mouth daily.           Iron-Vitamins (GERITOL PO) Take 1 tablet by mouth daily.           lansoprazole (PREVACID) 30 MG capsule Take 30 mg by mouth daily.           hydrOXYzine (ATARAX/VISTARIL) 25 MG tablet Take 25 mg by mouth at bedtime.           vitamin C (ASCORBIC ACID) 500 MG tablet Take 500 mg by mouth daily.           cholecalciferol (VITAMIN D) 1000 UNITS tablet Take 1,000 Units by mouth daily.           Zinc 50 MG CAPS Take 50 mg by mouth daily.           silver sulfADIAZINE (SILVADENE) 1 % cream Apply 1 application topically daily as needed. For cracking skin on feet.           metFORMIN (GLUCOPHAGE) 500 MG tablet Take 250 mg by mouth 1 day or 1 dose.             Activity: no sports or lifting more than 15 pounds for 6 weeks. No driving a car for 1-61 days. She was told she could shower. Diet: low fat, low cholesterol diet Wound Care: none needed  Follow-up:  With Dr. Derrell Lolling in 7 days.  Signed: Angelia Mould. Derrell Lolling, M.D., FACS General and minimally invasive surgery Breast and Colorectal Surgery  10/26/2011, 6:32 AM

## 2011-10-26 NOTE — Telephone Encounter (Signed)
Called and spoke with patient daughter, advised of both nurse only visit to remove staples and post op visit with Derrell Lolling on 11/24/11. Advised if she develops any complications after the staples have been removed to contact our office.

## 2011-10-26 NOTE — Progress Notes (Signed)
3 Days Post-Op  Subjective: Doing well. Minimal pain. Tolerating regular diet. Passing lots of flatus. Wants to go home.  Objective: Vital signs in last 24 hours: Temp:  [97.8 F (36.6 C)-98.3 F (36.8 C)] 98.3 F (36.8 C) (07/29 0544) Pulse Rate:  [99-102] 101  (07/29 0544) Resp:  [18-20] 20  (07/29 0544) BP: (121-136)/(73-80) 135/80 mmHg (07/29 0544) SpO2:  [95 %-97 %] 96 % (07/29 0544) Last BM Date: 10/23/11  Intake/Output from previous day: 07/28 0701 - 07/29 0700 In: 2306.5 [P.O.:840; I.V.:1466.5] Out: 1600 [Urine:1600] Intake/Output this shift: Total I/O In: -  Out: 300 [Urine:300]  General appearance: alert. And comfortable. No distress. Mental status normal. GI: soft. Active bowel sounds. Liver and spleen enlarged but nontender. Somewhat obese. All wounds look fine.  Lab Results:  Results for orders placed during the hospital encounter of 10/23/11 (from the past 24 hour(s))  GLUCOSE, CAPILLARY     Status: Abnormal   Collection Time   10/25/11  7:39 AM      Component Value Range   Glucose-Capillary 132 (*) 70 - 99 mg/dL   Comment 1 Documented in Chart     Comment 2 Notify RN    GLUCOSE, CAPILLARY     Status: Abnormal   Collection Time   10/25/11 11:59 AM      Component Value Range   Glucose-Capillary 161 (*) 70 - 99 mg/dL   Comment 1 Documented in Chart     Comment 2 Notify RN    GLUCOSE, CAPILLARY     Status: Abnormal   Collection Time   10/25/11  5:12 PM      Component Value Range   Glucose-Capillary 126 (*) 70 - 99 mg/dL   Comment 1 Notify RN    GLUCOSE, CAPILLARY     Status: Abnormal   Collection Time   10/25/11 10:01 PM      Component Value Range   Glucose-Capillary 127 (*) 70 - 99 mg/dL   Comment 1 Notify RN     Comment 2 Documented in Chart       Studies/Results: @RISRSLT24 @     . amLODipine  5 mg Oral q morning - 10a  . FLUoxetine  40 mg Oral Daily  . gabapentin  300 mg Oral QID  . insulin aspart  0-20 Units Subcutaneous TID WC  .  LORazepam  1 mg Oral TID  . metFORMIN  250 mg Oral QAC lunch  . pantoprazole  40 mg Oral QAC breakfast  . polyethylene glycol  17 g Oral Daily  . ursodiol  300 mg Oral TID  . DISCONTD: antiseptic oral rinse  15 mL Mouth Rinse q12n4p  . DISCONTD: chlorhexidine  15 mL Mouth Rinse BID     Assessment/Plan: s/p Procedure(s): LAPAROSCOPIC VENTRAL HERNIA INSERTION OF MESH  POD #3. Stable. [name]   discharge home today Diet, activities, and wound care discussed. Prescription for hydrocodone given Return to see me in office in 5-8 days for staple removal.  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 NO Tylenol.   Type 2 diabetes mellitus.CBGs in 94-161 range. Acceptable.   Hypertension.controlled   Severe anxiety and depression. Continue Ativan and Prozac.      LOS: 3 days    Konrad Hoak M. Derrell Lolling, M.D., [facility] General and Minimally invasive Surgery Breast and Colorectal Surgery Office:   [phone] Pager:   [phone]  10/26/2011  . .prob

## 2011-11-02 ENCOUNTER — Ambulatory Visit (INDEPENDENT_AMBULATORY_CARE_PROVIDER_SITE_OTHER): Payer: Medicare Other

## 2011-11-02 DIAGNOSIS — Z4802 Encounter for removal of sutures: Secondary | ICD-10-CM

## 2011-11-02 NOTE — Progress Notes (Signed)
The patient comes in for staple removal s/p hernia repair.  She is doing well.  She has 13 laparoscopic incisions with staples and some with gauze and Tegaderm.  There are 2 incisions on the left that are a bit pink.  I think it may be from the staples so I asked her to watch them for signs of infection.  I removed the staples.  The incisions were healed so I did not apply steristrips.  I gave her some instructions and she will follow up with Dr Derrell Lolling as scheduled.

## 2011-11-02 NOTE — Patient Instructions (Addendum)
I have removed all of your staples.  Please continue to shower and keep the areas clean and dry.  Call with any concerns of infection that we discussed such as redness, drainage or fever.  Keep your scheduled appointment with Dr Derrell Lolling.

## 2011-11-06 ENCOUNTER — Encounter (INDEPENDENT_AMBULATORY_CARE_PROVIDER_SITE_OTHER): Payer: Self-pay

## 2011-11-17 ENCOUNTER — Encounter (INDEPENDENT_AMBULATORY_CARE_PROVIDER_SITE_OTHER): Payer: Self-pay | Admitting: Internal Medicine

## 2011-11-17 ENCOUNTER — Ambulatory Visit (INDEPENDENT_AMBULATORY_CARE_PROVIDER_SITE_OTHER): Payer: Medicare Other | Admitting: Internal Medicine

## 2011-11-17 VITALS — BP 110/70 | HR 74 | Temp 98.8°F | Resp 20 | Wt 153.6 lb

## 2011-11-17 DIAGNOSIS — K743 Primary biliary cirrhosis: Secondary | ICD-10-CM

## 2011-11-17 DIAGNOSIS — K745 Biliary cirrhosis, unspecified: Secondary | ICD-10-CM

## 2011-11-17 DIAGNOSIS — R21 Rash and other nonspecific skin eruption: Secondary | ICD-10-CM

## 2011-11-17 MED ORDER — FLUOCINONIDE 0.05 % EX CREA: TOPICAL_CREAM | Freq: Two times a day (BID) | CUTANEOUS | Status: DC

## 2011-11-17 NOTE — Patient Instructions (Signed)
Notify if skin rash not resolved within 2 weeks of treatment.

## 2011-11-17 NOTE — Progress Notes (Signed)
Presenting complaint;  Followup for PBC. Patient complains of rash at left flank.  Subjective:  Patient is 66 year old Caucasian female who was advanced primary biliary cirrhosis who is here for scheduled visit accompanied by her daughter [name]. She was last seen in April 2013. She had uneventful laparoscopic repair of multiple ventral hernias by Dr. Delane Ginger. He kindly sent me a laparoscopic pictures showing enlarged nodular liver and enlarged spleen. She feels much better. She still has some soreness at mid abdomen. She has lost 17 pounds in the last 4 months. She denies melena or rectal bleeding. Her bowels move daily. She has not experienced any episode of confusion recently. Dr. Sherryll Burger has not been successful in decreasing doses of gabapentin. She complains of a rash with itching over her left flank which started about 2 weeks ago.  Current Medications: Current Outpatient Prescriptions  Medication Sig Dispense Refill  . amLODipine (NORVASC) 5 MG tablet Take 5 mg by mouth every morning.      [name] b complex vitamins capsule Take 1 capsule by mouth daily.       . bifidobacterium infantis (ALIGN) capsule Take 1 capsule by mouth daily.      . calcium-vitamin D (OSCAL WITH D) 500-200 MG-UNIT per tablet Take 1 tablet by mouth 2 (two) times daily.      . cholecalciferol (VITAMIN D) 1000 UNITS tablet Take 1,000 Units by mouth daily.      . ferrous fumarate (HEMOCYTE - 106 MG FE) 325 (106 FE) MG TABS Take 1 tablet by mouth daily.      . fexofenadine (ALLEGRA) 60 MG tablet Take 60 mg by mouth daily.      [name] FLUoxetine (PROZAC) 20 MG capsule Take 40 mg by mouth daily.      [name] gabapentin (NEURONTIN) 300 MG capsule Take 300 mg by mouth 4 (four) times daily.        . hydrOXYzine (ATARAX/VISTARIL) 25 MG tablet Take 25 mg by mouth at bedtime.      . Iron-Vitamins (GERITOL PO) Take 1 tablet by mouth daily.      . lansoprazole (PREVACID) 30 MG capsule Take 30 mg by mouth daily.      [name] LORazepam (ATIVAN) 1  MG tablet Take 1 mg by mouth 3 (three) times daily.      . metFORMIN (GLUCOPHAGE) 500 MG tablet Take 250 mg by mouth 1 day or 1 dose.      . potassium chloride SA (K-DUR,KLOR-CON) 20 MEQ tablet Take 20 mEq by mouth daily as needed. Take for swelling along with Lasix.      [name] silver sulfADIAZINE (SILVADENE) 1 % cream Apply 1 application topically daily as needed. For cracking skin on feet.      . ursodiol (ACTIGALL) 300 MG capsule take 1 capsule by mouth three times a day  270 capsule  3  . vitamin C (ASCORBIC ACID) 500 MG tablet Take 500 mg by mouth daily.      . Zinc 50 MG CAPS Take 50 mg by mouth daily.         Objective: Blood pressure 110/70, pulse 74, temperature 98.8 F (37.1 C), temperature source Oral, resp. rate 20, weight 153 lb 9.6 oz (69.673 kg). Patient is alert and without asterixis. She is able to move from chair to examination table without any difficulty. Conjunctiva is pink. Sclera is nonicteric Oropharyngeal mucosa is normal. No neck masses or thyromegaly noted. Cardiac exam with regular rhythm normal S1 and S2. No murmur or gallop  noted. Lungs are clear to auscultation. Abdomen is full. She has hepatosplenomegaly. She has erythematous maculopapular rash over left flank without skin or excoriation or vesicles. No LE edema or clubbing noted.    Assessment:  #1. Primary biliary cirrhosis. She has preserved hepatic function despite having stigmata of portal hypertension. She had uneventful recovery following repair of complex abdominal hernia. She possibly also has fatty liver and I'm very pleased to see that she has lost 17 pounds and she needs to make sure that she does not gain it back. #2. Skin rash at left abdomen/flank. #3. History of iron deficiency anemia. H&H was normal prior to recent surgery.   Plan:  Lidex cream 0.05% be applied to area of the rash twice a day until resolved. Call if not better in 2 weeks. Office visit in 6 months.

## 2011-11-22 DIAGNOSIS — R21 Rash and other nonspecific skin eruption: Secondary | ICD-10-CM | POA: Insufficient documentation

## 2011-11-24 ENCOUNTER — Ambulatory Visit (INDEPENDENT_AMBULATORY_CARE_PROVIDER_SITE_OTHER): Payer: Medicare Other | Admitting: General Surgery

## 2011-11-24 ENCOUNTER — Encounter (INDEPENDENT_AMBULATORY_CARE_PROVIDER_SITE_OTHER): Payer: Self-pay | Admitting: General Surgery

## 2011-11-24 VITALS — BP 130/72 | HR 74 | Temp 97.6°F | Resp 16 | Ht 64.0 in | Wt 154.1 lb

## 2011-11-24 DIAGNOSIS — K439 Ventral hernia without obstruction or gangrene: Secondary | ICD-10-CM

## 2011-11-24 NOTE — Progress Notes (Signed)
Patient ID: Ann Ward, female   DOB: 12/22/1945, 66 y.o.   MRN: [id number]  History:   This patient underwent laparoscopic repair of 2 ventral incisional hernias on 10/23/2011. I used a 20 x 25 cm piece of pariatex-Composite mesh. She has done well and has no complaints. She has a little bit of discomfort at her umbilicus but otherwise is doing well. No GI symptoms. Not taking any pain medication. Significant comorbidities include chemotherapy-induced cirrhosis, hepatosplenomegaly and portal hypertension. Diabetes mellitus, hypertension, anxiety and depression, and history of breast cancer.   Exam:    patient looks well. No distress.  Abdomen is somewhat protuberant but all the incisions have healed. There is no fluid collection. The hernia repair is intact and non tender.   Assessment:   Complex ventral incisional hernias, uneventful recovery following laparoscopic repair with large sheet of mesh Chemotherapy-induced cirrhosis, hepatosplenomegaly and portal hypertension Diabetes mellitus Anxiety and depression Hypertension History of breast cancer   Plan:    She was reassured. Unlimited ambulation. No sports or lifting more than 15 pounds indefinitely Return to see me when necessary.      Angelia Mould. Derrell Lolling, M.D., [facility] General and Minimally invasive Surgery Breast and Colorectal Surgery Office:   [phone] Pager:   [phone]

## 2011-11-24 NOTE — Patient Instructions (Signed)
You have recovered from the laparoscopic repair of your multiple incisional hernias with mesh without any obvious surgical complication. The hernia repair appears intact.  You are advised to avoid sports or lifting more than 15 pounds indefinitely.  Return to see Dr. Derrell Lolling if any further problems arise.

## 2011-12-13 ENCOUNTER — Other Ambulatory Visit (INDEPENDENT_AMBULATORY_CARE_PROVIDER_SITE_OTHER): Payer: Self-pay | Admitting: Internal Medicine

## 2012-01-10 ENCOUNTER — Other Ambulatory Visit (INDEPENDENT_AMBULATORY_CARE_PROVIDER_SITE_OTHER): Payer: Self-pay | Admitting: Internal Medicine

## 2012-05-23 ENCOUNTER — Encounter (INDEPENDENT_AMBULATORY_CARE_PROVIDER_SITE_OTHER): Payer: Self-pay | Admitting: Internal Medicine

## 2012-05-23 ENCOUNTER — Ambulatory Visit (INDEPENDENT_AMBULATORY_CARE_PROVIDER_SITE_OTHER): Payer: Medicare Other | Admitting: Internal Medicine

## 2012-05-23 VITALS — BP 110/68 | HR 74 | Temp 97.9°F | Resp 20 | Ht 64.0 in | Wt 169.9 lb

## 2012-05-23 DIAGNOSIS — R1310 Dysphagia, unspecified: Secondary | ICD-10-CM | POA: Insufficient documentation

## 2012-05-23 DIAGNOSIS — R635 Abnormal weight gain: Secondary | ICD-10-CM

## 2012-05-23 NOTE — Patient Instructions (Signed)
Physician will contact you with results of tests when completed. Will also request copy of blood work from Dr. Margaretmary Eddy office

## 2012-05-23 NOTE — Progress Notes (Signed)
Presenting complaint;  Followup for PBC. Patient complains of dysphagia.  Subjective:  Patient is 67 year old Caucasian female with history of PBC who is here for her scheduled visit accompanied by sister-in-law. She has been under a lot of stress since she lost her husband in October last year. She has gained 16 pounds since her last visit and complains of abdominal distention. She denies nausea vomiting abdominal pain melena or rectal bleeding. She is complaining of dysphagia. She has difficulty swallowing solids and also strangles on liquids. This symptom started few weeks ago. She states her appetite is very good. She also complains of numbness in her fingertips and toes and feet but this has not changed. She denies confusion or excessive sleep.  Current Medications: Current Outpatient Prescriptions  Medication Sig Dispense Refill  . ABILIFY 2 MG tablet Take 2 mg by mouth daily.       [name] amLODipine (NORVASC) 5 MG tablet Take 5 mg by mouth every morning.      [name] b complex vitamins capsule Take 1 capsule by mouth daily.       . bifidobacterium infantis (ALIGN) capsule Take 1 capsule by mouth daily.      . calcium-vitamin D (OSCAL WITH D) 500-200 MG-UNIT per tablet Take 1 tablet by mouth 2 (two) times daily.      . cholecalciferol (VITAMIN D) 1000 UNITS tablet Take 1,000 Units by mouth daily.      . ferrous fumarate (HEMOCYTE - 106 MG FE) 325 (106 FE) MG TABS Take 1 tablet by mouth daily.      . fexofenadine (ALLEGRA) 60 MG tablet Take 60 mg by mouth daily.      . fluocinonide cream (LIDEX) 0.05 % Apply topically 2 (two) times daily. Applied to area with skin rash twice daily under rash resolved  30 g  0  . FLUoxetine (PROZAC) 20 MG capsule Take 40 mg by mouth daily.      [name] gabapentin (NEURONTIN) 300 MG capsule Take 300 mg by mouth 4 (four) times daily.        . hydrOXYzine (ATARAX/VISTARIL) 25 MG tablet Take 25 mg by mouth at bedtime.      . Iron-Vitamins (GERITOL PO) Take 1 tablet by mouth  daily.      . lansoprazole (PREVACID) 30 MG capsule take 1 capsule by mouth once daily  30 capsule  11  . LORazepam (ATIVAN) 1 MG tablet Take 1 mg by mouth 3 (three) times daily.      . metFORMIN (GLUCOPHAGE) 500 MG tablet Take 250 mg by mouth 1 day or 1 dose.      . potassium chloride SA (K-DUR,KLOR-CON) 20 MEQ tablet Take 20 mEq by mouth daily as needed. Take for swelling along with Lasix.      [name] silver sulfADIAZINE (SILVADENE) 1 % cream Apply 1 application topically daily as needed. For cracking skin on feet.      . ursodiol (ACTIGALL) 300 MG capsule take 1 capsule by mouth three times a day  270 capsule  3  . vitamin C (ASCORBIC ACID) 500 MG tablet Take 500 mg by mouth daily.      . Zinc 50 MG CAPS Take 50 mg by mouth daily.       No current facility-administered medications for this visit.     Objective: Blood pressure 110/68, pulse 74, temperature 97.9 F (36.6 C), temperature source Oral, resp. rate 20, height 5\' 4"  (1.626 m), weight 169 lb 14.4 oz (77.066 kg). Patient is alert  and in no acute distress. Asterixis is absent. Conjunctiva is pink. Sclera is nonicteric Oropharyngeal mucosa is normal. No neck masses or thyromegaly noted. Cardiac exam with regular rhythm normal S1 and S2. No murmur or gallop noted. Lungs are clear to auscultation. Abdomen is distended and somewhat tense. It is not tender. Prominent and from the left lobe of the liver. Spleen is not palpable. Flanks are dull.  No LE edema or clubbing noted.    Assessment:  #1. Primary biliary cirrhosis. She has gained 16 pounds since her last visit and abdomen is quite distended. Need to rule out ascites. #2. Dysphagia. Symptoms suggest of both oral pharyngeal and esophageal dysphagia which will be further evaluated with BPE. Her last EGD was in December 2011 and no ring or stricture was identified.   Plan: Barium pill esophagogram. Hepatobiliary ultrasound to examine her liver and also look for ascites. Will  request copy of blood work from Dr. Margaretmary Eddy office before any test ordered.

## 2012-05-27 ENCOUNTER — Ambulatory Visit (HOSPITAL_COMMUNITY)
Admission: RE | Admit: 2012-05-27 | Discharge: 2012-05-27 | Disposition: A | Discharge status: Home / Self Care | Payer: Medicare Other | Source: Ambulatory Visit | Attending: Internal Medicine | Admitting: Internal Medicine

## 2012-05-27 DIAGNOSIS — R161 Splenomegaly, not elsewhere classified: Secondary | ICD-10-CM | POA: Insufficient documentation

## 2012-05-27 DIAGNOSIS — K7460 Unspecified cirrhosis of liver: Secondary | ICD-10-CM | POA: Insufficient documentation

## 2012-05-27 DIAGNOSIS — R635 Abnormal weight gain: Secondary | ICD-10-CM

## 2012-05-27 DIAGNOSIS — K8020 Calculus of gallbladder without cholecystitis without obstruction: Secondary | ICD-10-CM | POA: Insufficient documentation

## 2012-05-27 DIAGNOSIS — R932 Abnormal findings on diagnostic imaging of liver and biliary tract: Secondary | ICD-10-CM | POA: Insufficient documentation

## 2012-05-27 DIAGNOSIS — K745 Biliary cirrhosis, unspecified: Secondary | ICD-10-CM | POA: Insufficient documentation

## 2012-05-27 DIAGNOSIS — R1310 Dysphagia, unspecified: Secondary | ICD-10-CM | POA: Insufficient documentation

## 2012-05-30 ENCOUNTER — Other Ambulatory Visit (INDEPENDENT_AMBULATORY_CARE_PROVIDER_SITE_OTHER): Payer: Self-pay | Admitting: Internal Medicine

## 2012-05-30 MED ORDER — FUROSEMIDE 20 MG PO TABS: 20.0000 mg | ORAL_TABLET | Freq: Every day | ORAL | Status: DC | PRN

## 2012-05-30 MED ORDER — POTASSIUM CHLORIDE CRYS ER 20 MEQ PO TBCR: 20.0000 meq | EXTENDED_RELEASE_TABLET | Freq: Every day | ORAL | Status: DC | PRN

## 2012-07-22 ENCOUNTER — Other Ambulatory Visit (INDEPENDENT_AMBULATORY_CARE_PROVIDER_SITE_OTHER): Payer: Self-pay | Admitting: Internal Medicine

## 2012-09-20 ENCOUNTER — Ambulatory Visit (INDEPENDENT_AMBULATORY_CARE_PROVIDER_SITE_OTHER): Payer: Medicare Other | Admitting: Internal Medicine

## 2012-11-01 ENCOUNTER — Other Ambulatory Visit (INDEPENDENT_AMBULATORY_CARE_PROVIDER_SITE_OTHER): Payer: Self-pay | Admitting: Internal Medicine

## 2012-11-07 ENCOUNTER — Ambulatory Visit (INDEPENDENT_AMBULATORY_CARE_PROVIDER_SITE_OTHER): Payer: Medicare Other | Admitting: Internal Medicine

## 2012-11-07 ENCOUNTER — Encounter (INDEPENDENT_AMBULATORY_CARE_PROVIDER_SITE_OTHER): Payer: Self-pay | Admitting: Internal Medicine

## 2012-11-07 VITALS — BP 140/78 | HR 84 | Temp 98.3°F | Resp 20 | Ht 64.0 in | Wt 163.7 lb

## 2012-11-07 DIAGNOSIS — K219 Gastro-esophageal reflux disease without esophagitis: Secondary | ICD-10-CM

## 2012-11-07 DIAGNOSIS — D509 Iron deficiency anemia, unspecified: Secondary | ICD-10-CM

## 2012-11-07 DIAGNOSIS — K745 Biliary cirrhosis, unspecified: Secondary | ICD-10-CM

## 2012-11-07 DIAGNOSIS — K743 Primary biliary cirrhosis: Secondary | ICD-10-CM

## 2012-11-07 NOTE — Patient Instructions (Signed)
Physician will contact you with results of blood work. 

## 2012-11-07 NOTE — Progress Notes (Signed)
Presenting complaint;  Followup for PBC and iron deficiency anemia.  Subjective:  Patient is a 67 year old Caucasian female with history of PBC who is here for scheduled visit today by her daughter Ann Ward. Patient has lost about 6 pounds since her last visit. Her daughter states that she is doing much better. She goes to [place] with family every other weekend and she walks regularly. Bowels move twice daily. She denies melena or rectal bleeding. She also has not experienced any episodes of confusion. She is having to take furosemide once or twice a month. She had right cataract extraction in January 2014 with good results.  Current Medications: Current Outpatient Prescriptions  Medication Sig Dispense Refill  . ABILIFY 2 MG tablet Take 10 mg by mouth daily.       [name] amLODipine (NORVASC) 5 MG tablet Take 5 mg by mouth every morning.      [name] b complex vitamins capsule Take 1 capsule by mouth daily.       . bifidobacterium infantis (ALIGN) capsule Take 1 capsule by mouth daily.      . calcium-vitamin D (OSCAL WITH D) 500-200 MG-UNIT per tablet Take 1 tablet by mouth 2 (two) times daily.      . cholecalciferol (VITAMIN D) 1000 UNITS tablet Take 1,000 Units by mouth daily.      . ferrous fumarate (HEMOCYTE - 106 MG FE) 325 (106 FE) MG TABS Take 1 tablet by mouth daily.      . fexofenadine (ALLEGRA) 60 MG tablet Take 60 mg by mouth daily.      [name] FLUoxetine (PROZAC) 20 MG capsule Take 40 mg by mouth daily.      . furosemide (LASIX) 20 MG tablet take 1 tablet by mouth once daily if needed  30 tablet  2  . gabapentin (NEURONTIN) 300 MG capsule Take 300 mg by mouth. Per daughter she takes 2 in the morning, 1 at lunch , 1 at 5 pm or 6 pm and 2 at bedtime.      . hydrOXYzine (ATARAX/VISTARIL) 25 MG tablet Take 25 mg by mouth at bedtime.      . Iron-Vitamins (GERITOL PO) Take 1 tablet by mouth daily.      . lansoprazole (PREVACID) 30 MG capsule take 1 capsule by mouth once daily  30 capsule  11  . LORazepam  (ATIVAN) 1 MG tablet Take 1 mg by mouth 3 (three) times daily.      . metFORMIN (GLUCOPHAGE) 500 MG tablet Take 250 mg by mouth 1 day or 1 dose.      . potassium chloride SA (K-DUR,KLOR-CON) 20 MEQ tablet Take 1 tablet (20 mEq total) by mouth daily as needed. Take for swelling along with Lasix.  30 tablet  1  . silver sulfADIAZINE (SILVADENE) 1 % cream Apply 1 application topically daily as needed. For cracking skin on feet.      . ursodiol (ACTIGALL) 300 MG capsule take 1 capsule by mouth three times a day  270 capsule  3  . vitamin C (ASCORBIC ACID) 500 MG tablet Take 500 mg by mouth daily.      . Zinc 50 MG CAPS Take 50 mg by mouth daily.       No current facility-administered medications for this visit.     Objective: Blood pressure 140/78, pulse 84, temperature 98.3 F (36.8 C), temperature source Oral, resp. rate 20, height 5\' 4"  (1.626 m), weight 163 lb 11.2 oz (74.254 kg). Patient is alert and without asterixis. Conjunctiva  is pink. Sclera is nonicteric Oropharyngeal mucosa is normal. No neck masses or thyromegaly noted. Cardiac exam with regular rhythm normal S1 and S2. No murmur or gallop noted. Lungs are clear to auscultation. Abdomen is full. She has an enlarged liver with prominent left lobe which is slightly tender. Spleen is also palpable below LCM. Shifting dullness is absent.  No LE edema or clubbing noted.  Labs/studies Results: She had ultrasound on 06/02/2012 revealing cholelithiasis gallbladder wall thickening nodular contour to liver consistent with cirrhosis and splenomegaly.    Assessment:  #1. Primary biliary cirrhosis. She remains with compensated disease. #2. History of iron deficiency anemia. H&H on July 2013 was 10.8 and 33.4. #3. GERD. Symptoms well controlled with therapy.    Plan: Continue lansoprazole and Urso at present dose. Patient will go to the lab for CBC, comprehensive chemistry panel and AFP. Schedule hepatic ultrasound for HCC  screening but  first her daughter will check with her insurance to make sure it's covered. Office visit in 6 months.

## 2012-11-08 LAB — AFP TUMOR MARKER: AFP-Tumor Marker: 4.6 ng/mL (ref 0.0–8.0)

## 2012-11-08 LAB — COMPREHENSIVE METABOLIC PANEL
BUN: 17 mg/dL (ref 6–23)
CO2: 32 mEq/L (ref 19–32)
Calcium: 9.7 mg/dL (ref 8.4–10.5)
Chloride: 101 mEq/L (ref 96–112)
Creat: 1.13 mg/dL — ABNORMAL HIGH (ref 0.50–1.10)
Glucose, Bld: 97 mg/dL (ref 70–99)

## 2012-11-08 LAB — CBC
Hemoglobin: 12.6 g/dL (ref 12.0–15.0)
MCV: 78.4 fL (ref 78.0–100.0)
RDW: 17.5 % — ABNORMAL HIGH (ref 11.5–15.5)

## 2013-01-09 ENCOUNTER — Other Ambulatory Visit (INDEPENDENT_AMBULATORY_CARE_PROVIDER_SITE_OTHER): Payer: Self-pay | Admitting: Internal Medicine

## 2013-01-21 ENCOUNTER — Other Ambulatory Visit (INDEPENDENT_AMBULATORY_CARE_PROVIDER_SITE_OTHER): Payer: Self-pay | Admitting: Internal Medicine

## 2013-01-27 ENCOUNTER — Other Ambulatory Visit (INDEPENDENT_AMBULATORY_CARE_PROVIDER_SITE_OTHER): Payer: Self-pay | Admitting: Internal Medicine

## 2013-02-02 ENCOUNTER — Other Ambulatory Visit: Payer: Self-pay

## 2013-04-18 ENCOUNTER — Other Ambulatory Visit (INDEPENDENT_AMBULATORY_CARE_PROVIDER_SITE_OTHER): Payer: Self-pay | Admitting: Internal Medicine

## 2013-05-12 ENCOUNTER — Telehealth (INDEPENDENT_AMBULATORY_CARE_PROVIDER_SITE_OTHER): Payer: Self-pay | Admitting: *Deleted

## 2013-05-12 NOTE — Telephone Encounter (Signed)
Ann Ward has a foot infection that has not healed or closed up. Lattie Haw is currently taking her mother to the doctor weekly and they both are tired. The apt for 05/16/15 has been cancelled and will reschedule at a later date. Dr. Manuella Ghazi is one of the doctors that is overseeing her care and she will have him fax a copy of all lab work done.

## 2013-05-15 ENCOUNTER — Ambulatory Visit (INDEPENDENT_AMBULATORY_CARE_PROVIDER_SITE_OTHER): Payer: Medicare Other | Admitting: Internal Medicine

## 2013-05-15 NOTE — Telephone Encounter (Signed)
Dr.Rehman was made aware. 

## 2013-07-12 ENCOUNTER — Telehealth (INDEPENDENT_AMBULATORY_CARE_PROVIDER_SITE_OTHER): Payer: Self-pay | Admitting: *Deleted

## 2013-07-12 NOTE — Telephone Encounter (Signed)
Yes she can;please call patient

## 2013-07-12 NOTE — Telephone Encounter (Signed)
I have called Ann Ward and given Ann the lab results rec'd from [facility] , per Dr.Rehman's note. Daughter explains that patient has been under doctors's care in regards of Ann foot. There has ben a problem with them getting it to heal. At this time it is about the size of a pin but want close. Patient apparently has fallen and fractured Ann shoulder since she was last seen here.   Ann Ward is asking if Ann Ward can wait till after Summer to be seen by Dr.Rehman? Ann appointment was canceled in February due to all the other appointments that she had.

## 2013-07-14 NOTE — Telephone Encounter (Signed)
Apt has been scheduled for 11/20/13 with Dr. Laural Golden.

## 2013-07-18 ENCOUNTER — Other Ambulatory Visit (INDEPENDENT_AMBULATORY_CARE_PROVIDER_SITE_OTHER): Payer: Self-pay | Admitting: Internal Medicine

## 2013-11-13 ENCOUNTER — Telehealth (INDEPENDENT_AMBULATORY_CARE_PROVIDER_SITE_OTHER): Payer: Self-pay | Admitting: *Deleted

## 2013-11-13 NOTE — Telephone Encounter (Signed)
Per Lattie Haw, patient's daughter, her mother is in the doughnut hole, middle of the gap.  The Ursodiol will cost her $164.00 out of pocket.  Patient takes 300 mg Three times a day and she has not had her medication today. Lattie Haw ask if this was something that we sample or could we help her get the medication. She has an appointment with [place] on 11/20/13. Lattie Haw thinks that she has about $1000.00  To pay put herself before she will be out of the hole.  Patient uses [facility] in [place]. Lattie Haw was advised that we would call her back once we had reviewed any possible help for this medication.

## 2013-11-13 NOTE — Telephone Encounter (Signed)
Lattie Haw, patient's daughter, said they have ran into a problem getting her mother's medicine Ursodiol. Would like to speak with Tammy if she would please give her a call back at [phone].

## 2013-11-14 NOTE — Telephone Encounter (Signed)
I have called pharmacies and the patient 's insurance ,she is in a [place]. For a 90 day supply at her pharmacy per Corine it would be over $160.00. They cannot run a savings card and the insurance , as it will be higher cost for the patient. Online pharmacy will take 3 -4 business days to get medication and per her daughter , Lattie Haw, she needs something now. She states that she is going to call and see if and how much a partial prescription would be.  Dr.Rehman was made aware of the medication being expensive,ect.

## 2013-11-20 ENCOUNTER — Ambulatory Visit (INDEPENDENT_AMBULATORY_CARE_PROVIDER_SITE_OTHER): Payer: Medicare Other | Admitting: Internal Medicine

## 2013-11-20 ENCOUNTER — Encounter (INDEPENDENT_AMBULATORY_CARE_PROVIDER_SITE_OTHER): Payer: Self-pay | Admitting: Internal Medicine

## 2013-11-20 VITALS — BP 130/76 | HR 82 | Temp 97.9°F | Resp 18 | Ht 64.0 in | Wt 148.9 lb

## 2013-11-20 DIAGNOSIS — K745 Biliary cirrhosis, unspecified: Secondary | ICD-10-CM

## 2013-11-20 DIAGNOSIS — D509 Iron deficiency anemia, unspecified: Secondary | ICD-10-CM

## 2013-11-20 DIAGNOSIS — K219 Gastro-esophageal reflux disease without esophagitis: Secondary | ICD-10-CM

## 2013-11-20 DIAGNOSIS — K743 Primary biliary cirrhosis: Secondary | ICD-10-CM

## 2013-11-20 NOTE — Patient Instructions (Signed)
Physician will call with results of ultrasound when completed. Next blood work in October 2015

## 2013-11-20 NOTE — Progress Notes (Signed)
Presenting complaint;  Followup for PBC iron deficiency anemia and GERD.   Subjective:  Patient is 68 year old Caucasian female who presents for scheduled visit accompanied by her son Suezanne Jacquet. She was last seen one year ago. She did not return for appointment 6 months earlier. She also has not had ultrasound in over a year. She states she did have blood work by Dr. Manuella Ghazi in April 2015(we requested a copy today). She says she developed right foot ulcer and required skin grafting. It took 8 months for it to heal. She is also having problem with her nerves. She was switched from Abilify to another medicine a few months ago because of cost now she is back on Abilify in generic form. Her son states she is doing much better but not back to her baseline. He tells me she also has been diagnosed with early dementia. She feels her depression is not well controlled. She has crying spells like she had one today. She tries to stay busy by watching TV and doing word search. She has lost 15 pounds since her last visit. She says her appetite is good but she is just too lazy to cook food. Heartburn is well controlled with therapy. She denies dysphagia Abdominal pain melena or rectal bleeding. She has intermittent diarrhea attributed to certain foods and controlled with Imodium. She is never constipated.   Current Medications: Outpatient Encounter Prescriptions as of 11/20/2013  Medication Sig  . ABILIFY 2 MG tablet Take 10 mg by mouth daily.   [name] amLODipine (NORVASC) 5 MG tablet Take 5 mg by mouth every morning.  [name] b complex vitamins capsule Take 1 capsule by mouth daily.   . bifidobacterium infantis (ALIGN) capsule Take 1 capsule by mouth daily.  . calcium-vitamin D (OSCAL WITH D) 500-200 MG-UNIT per tablet Take 1 tablet by mouth 2 (two) times daily.  . cholecalciferol (VITAMIN D) 1000 UNITS tablet Take 1,000 Units by mouth daily.  . ferrous fumarate (HEMOCYTE - 106 MG FE) 325 (106 FE) MG TABS Take 1 tablet by mouth  daily.  [name] FLUoxetine (PROZAC) 20 MG capsule Take 40 mg by mouth daily.  . furosemide (LASIX) 20 MG tablet take 1 tablet by mouth once daily  . gabapentin (NEURONTIN) 300 MG capsule Take 300 mg by mouth. Per daughter she takes 2 in the morning, 1 at lunch , 1 at 5 pm or 6 pm and 2 at bedtime.  . hydrOXYzine (VISTARIL) 25 MG capsule Take 25 mg by mouth at bedtime.  . Iron-Vitamins (GERITOL PO) Take 1 tablet by mouth daily.  . lansoprazole (PREVACID) 30 MG capsule take 1 capsule by mouth once daily  . loratadine (CLARITIN) 10 MG tablet Take 10 mg by mouth daily.  [name] LORazepam (ATIVAN) 1 MG tablet Take 1 mg by mouth. Patient takes 1/2 tablet at lunch time , and 1 by mouth at bedtime  . metFORMIN (GLUCOPHAGE) 500 MG tablet Take 250 mg by mouth 1 day or 1 dose.  . potassium chloride SA (K-DUR,KLOR-CON) 20 MEQ tablet Take 1 tablet (20 mEq total) by mouth daily as needed. Take for swelling along with Lasix.  [name] silver sulfADIAZINE (SILVADENE) 1 % cream Apply 1 application topically daily as needed. For cracking skin on feet.  . ursodiol (ACTIGALL) 300 MG capsule take 1 capsule three times a day  . vitamin C (ASCORBIC ACID) 500 MG tablet Take 500 mg by mouth daily.  . Zinc 50 MG CAPS Take 50 mg by mouth daily.  . [DISCONTINUED]  fexofenadine (ALLEGRA) 60 MG tablet Take 60 mg by mouth daily.     Objective: Blood pressure 130/76, pulse 82, temperature 97.9 F (36.6 C), temperature source Oral, resp. rate 18, height 5\' 4"  (1.626 m), weight 148 lb 14.4 oz (67.541 kg). Patient is alert and in no acute distress. Asterixis absent. Left eye is prostatic. Conjunctiva is pink. Sclera is nonicteric. Oropharyngeal mucosa is normal. No neck masses or thyromegaly noted. Cardiac exam with regular rhythm normal S1 and S2. Faint systolic ejection murmur noted at LLSB. Lungs are clear to auscultation. Abdomen is full. Spleen is palpable. Liver is enlarged and firm with prominent left lobe. It is mildly tender.  Shifting dullness is absent.  No LE edema or clubbing noted.  Labs/studies Results:  Lab data from 07/04/2013(Dr.Shah's office) AFP 3.5 WBC 2.7 and H&H 10.9 and 32.3 and platelet count 65K Bilirubin 0.7, AP 92, AST 16, ALT 10, albumin 4.2. 10 calcium 9.2 Serum sodium 138, potassium 4.9, right 95, CO2 29, BUN 9, creatinine 0.79 and glucose 112. Last ultrasound was in February 2014.   Assessment:  #1. PBC. Condition was diagnosed about 10 years ago hepatic function remains well preserved. Thrombocytopenia remained stable. Last EGD was in January 2012 revealing grade 1 esophageal varices. She had esophagogram the cause of dysphagia last year and no indices were identified. She will need to undergo EGD at some point in future to make sure she does not have large varices. Clinically she does not have ascites. #2. Iron deficiency anemia. She remains with mild anemia. No evidence of overt bleeding. She may also have an element of anemia of chronic disease. #3. GERD. Symptoms well controlled with therapy.    Plan:  Upper abdominal ultrasound. CBC and alpha-fetoprotein in October 2015. Patient advised to call office if she has melena or rectal bleeding. Office visit in 6 months.

## 2013-11-22 ENCOUNTER — Telehealth (INDEPENDENT_AMBULATORY_CARE_PROVIDER_SITE_OTHER): Payer: Self-pay | Admitting: *Deleted

## 2013-11-22 DIAGNOSIS — D509 Iron deficiency anemia, unspecified: Secondary | ICD-10-CM

## 2013-11-22 DIAGNOSIS — K219 Gastro-esophageal reflux disease without esophagitis: Secondary | ICD-10-CM

## 2013-11-22 DIAGNOSIS — K743 Primary biliary cirrhosis: Secondary | ICD-10-CM

## 2013-11-22 NOTE — Telephone Encounter (Signed)
Per Dr.Rehman the patient will need to have labs drawn October 2015.

## 2013-11-29 ENCOUNTER — Ambulatory Visit (HOSPITAL_COMMUNITY)
Admission: RE | Admit: 2013-11-29 | Discharge: 2013-11-29 | Disposition: A | Discharge status: Home / Self Care | Payer: Medicare Other | Source: Ambulatory Visit | Attending: Internal Medicine | Admitting: Internal Medicine

## 2013-11-29 ENCOUNTER — Other Ambulatory Visit (HOSPITAL_COMMUNITY): Payer: Medicare Other

## 2013-11-29 DIAGNOSIS — K8020 Calculus of gallbladder without cholecystitis without obstruction: Secondary | ICD-10-CM | POA: Diagnosis not present

## 2013-11-29 DIAGNOSIS — R161 Splenomegaly, not elsewhere classified: Secondary | ICD-10-CM | POA: Diagnosis not present

## 2013-11-29 DIAGNOSIS — K745 Biliary cirrhosis, unspecified: Secondary | ICD-10-CM | POA: Insufficient documentation

## 2013-11-29 DIAGNOSIS — K743 Primary biliary cirrhosis: Secondary | ICD-10-CM

## 2013-12-20 ENCOUNTER — Other Ambulatory Visit (INDEPENDENT_AMBULATORY_CARE_PROVIDER_SITE_OTHER): Payer: Self-pay | Admitting: *Deleted

## 2013-12-20 ENCOUNTER — Encounter (INDEPENDENT_AMBULATORY_CARE_PROVIDER_SITE_OTHER): Payer: Self-pay | Admitting: *Deleted

## 2013-12-20 DIAGNOSIS — K219 Gastro-esophageal reflux disease without esophagitis: Secondary | ICD-10-CM

## 2013-12-20 DIAGNOSIS — K743 Primary biliary cirrhosis: Secondary | ICD-10-CM

## 2013-12-20 DIAGNOSIS — D509 Iron deficiency anemia, unspecified: Secondary | ICD-10-CM

## 2013-12-27 IMAGING — CT CT ABD-PELV W/ CM
2 of 4 series · 14 of 46 positions shown, 16 images · IV contrast (Omnipaque 300)
Comparison: No priors.

CLINICAL DATA: Evaluate abdominal hernia.

CT ABDOMEN AND PELVIS WITH CONTRAST
TECHNIQUE: Multidetector CT imaging of the abdomen and pelvis was
performed following the standard protocol during bolus
administration of intravenous contrast.
Contrast: 100mL OMNIPAQUE IOHEXOL 300 MG/ML IJ SOLN

[Series 2: abd_pel_with 5.0 b40f · axial · 0.77mm/px · z∈[-488,-88]mm · 11 of 88 slices shown, 13 images]
[im 4/88  soft-tissue]
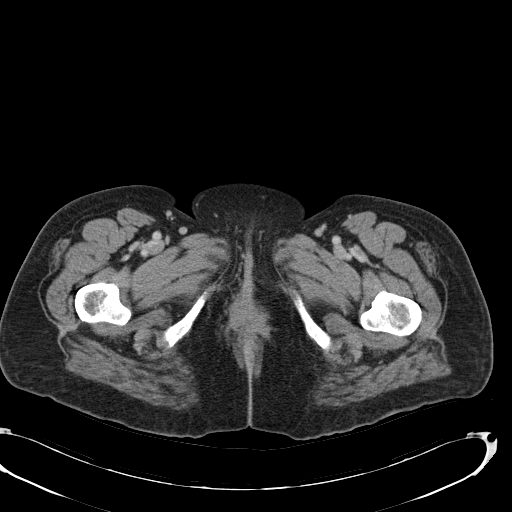
[im 4/88  bone]
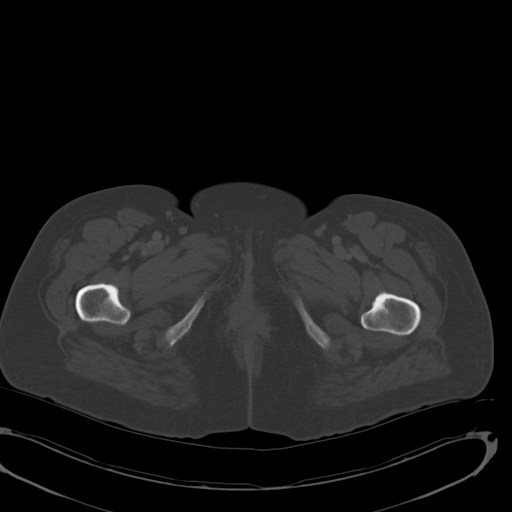
[im 12/88  soft-tissue]
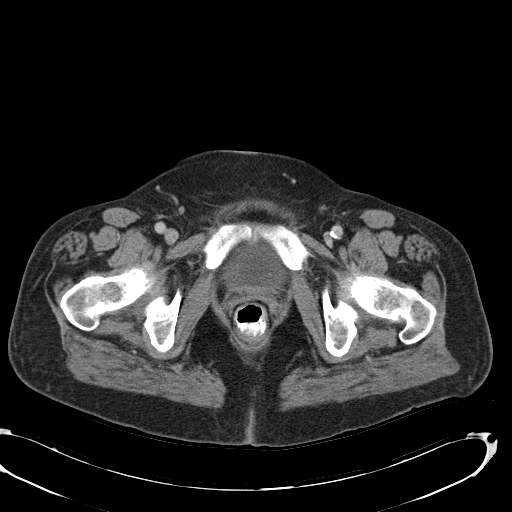
[im 20/88  soft-tissue]
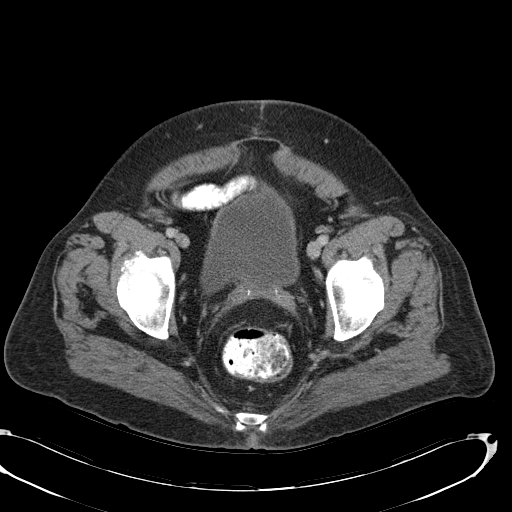
[im 28/88  soft-tissue]
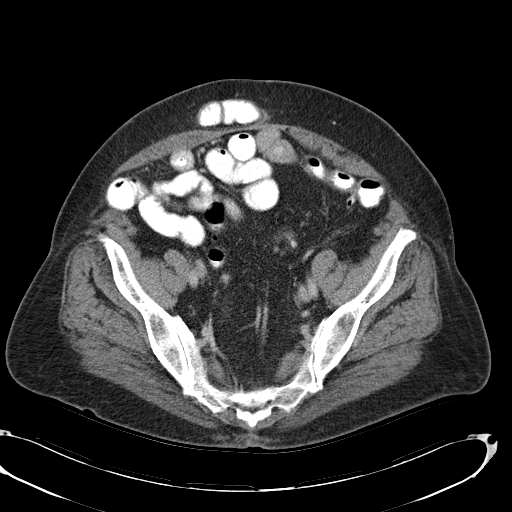
[im 36/88  soft-tissue]
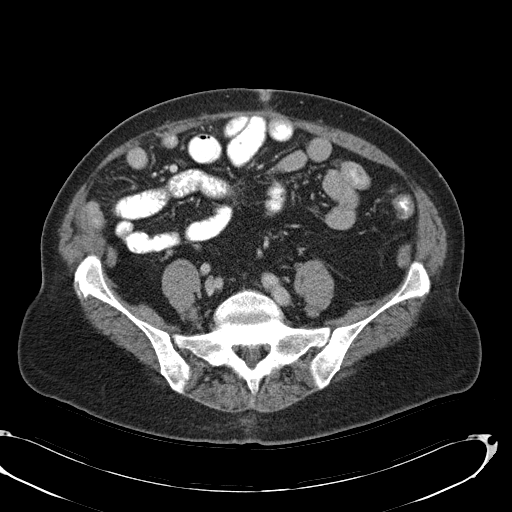
[im 44/88  soft-tissue]
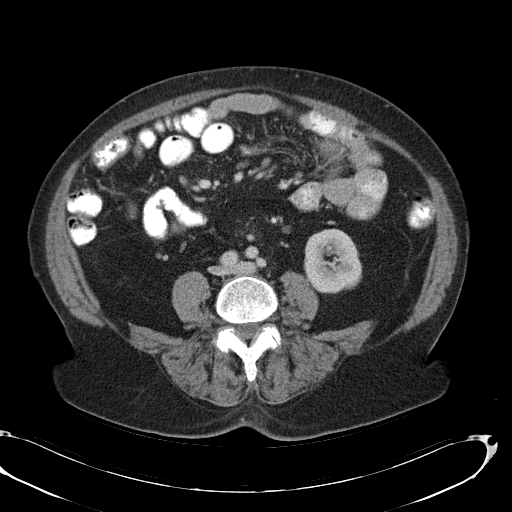
[im 52/88  soft-tissue]
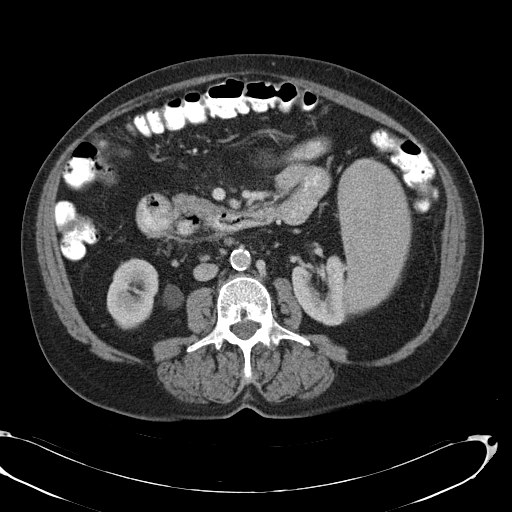
[im 60/88  soft-tissue]
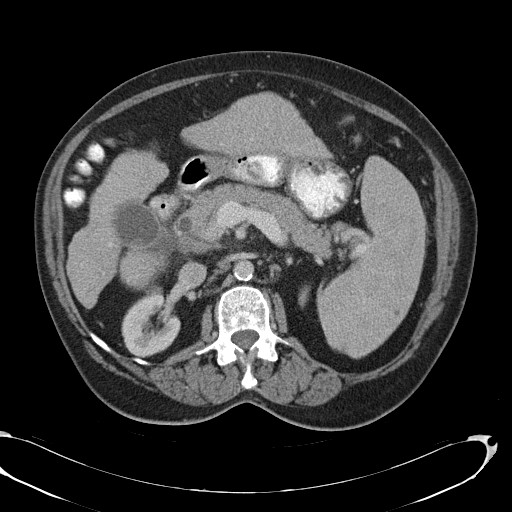
[im 68/88  soft-tissue]
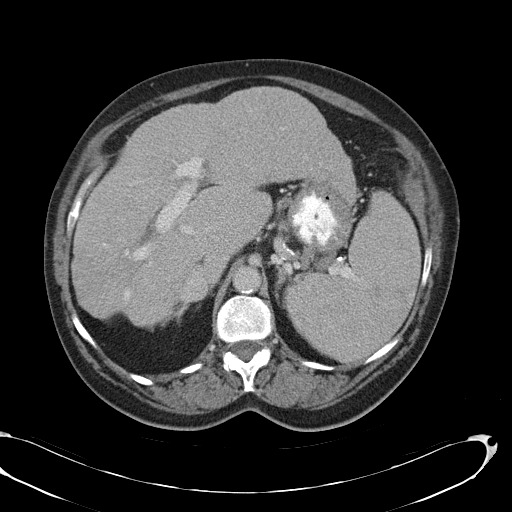
[im 68/88  bone]
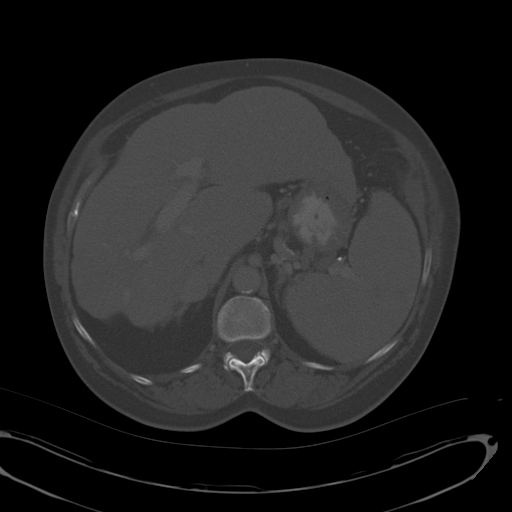
[im 76/88  soft-tissue]
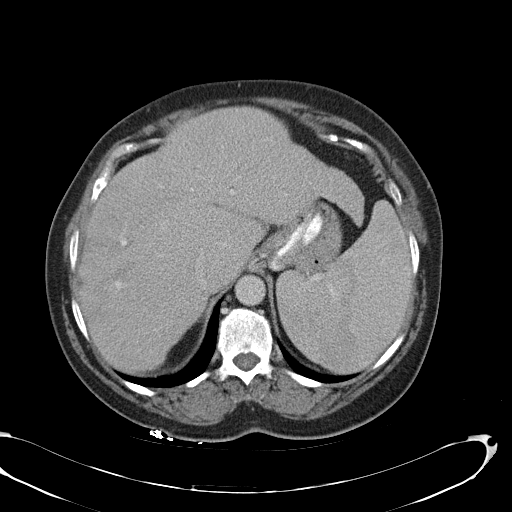
[im 84/88  soft-tissue]
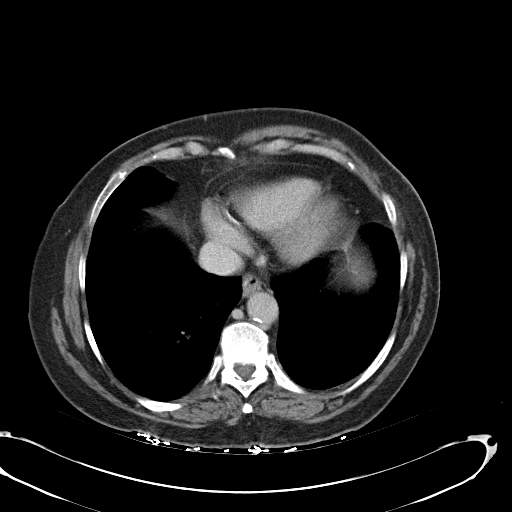

[Series 4: abd_pel_with 3.0 spo cor · coronal · 0.70mm/px · 3 of 107 slices shown]
[im 36/107  soft-tissue]
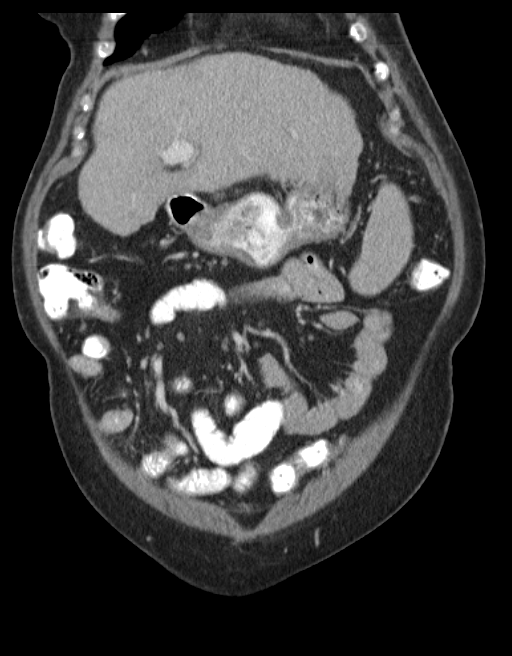
[im 48/107  soft-tissue]
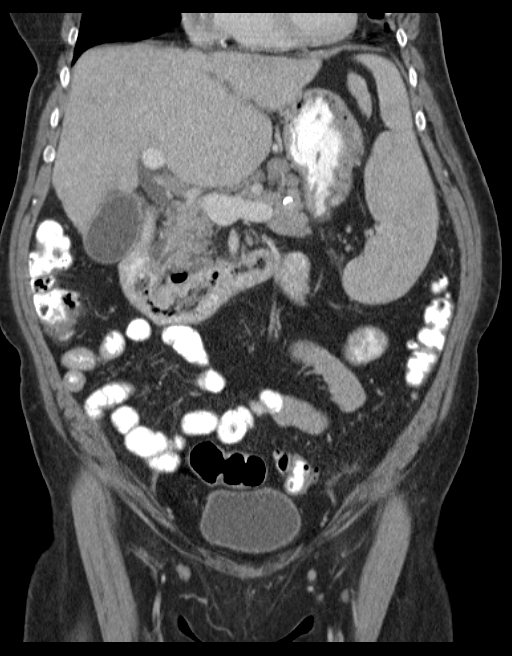
[im 59/107  soft-tissue]
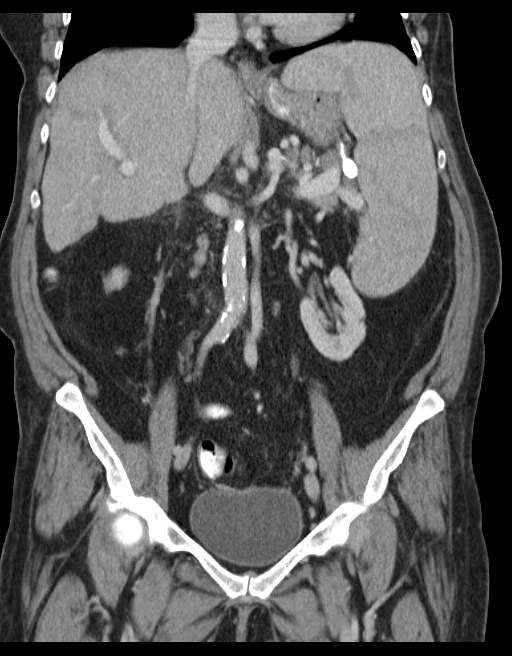

[14 of 46 positions shown; findings below may reference images not displayed]

FINDINGS: Lung Bases: Right coronary artery atherosclerosis.  Otherwise,
unremarkable..

Abdomen/Pelvis:  The liver has a nodular contour, which could
suggest underlying cirrhosis.  There is dilatation of the portal
vein (17 mm in diameter), and splenomegaly (15.7 cm AP), suggestive
of portal hypertension.  There is a small amount of high
attenuation material noted layering dependently within the lumen of
the gallbladder, that likely reflects tiny gallstones or biliary
sludge.  No intrahepatic biliary ductal dilatation.  Common bile
duct is mildly enlarged measuring 1 cm in the porta hepatis.  No
pancreatic ductal dilatation. There is some subtle inflammatory
changes posterior to the head and uncinate process of the pancreas.
In this same region, there is a diverticulum of the third portion
of the duodenum.  It is uncertain whether not this inflammatory
changes reflects process of the head uncinate process of the
pancreas, or potentially of the duodenum. The infused appearance of
the bilateral adrenal glands and bilateral kidneys is unremarkable.

There is a small ventral hernia in the anterior abdominal wall
which contains a short segment of the small bowel.  No evidence of
bowel obstruction from this hernia at this time.  There are
numerous colonic diverticula, predominately of the distal
descending colon and sigmoid colon, without surrounding
inflammatory changes to suggest acute colonic diverticulitis at
this time.  Status post total abdominal hysterectomy and bilateral
salpingo-oophorectomy.  Urinary bladder is unremarkable in
appearance. Atherosclerosis throughout the abdominal and pelvic
vasculature, without evidence of aneurysm or dissection.

Musculoskeletal: There are no aggressive appearing lytic or blastic
lesions noted in the visualized portions of the skeleton.
IMPRESSION: 1.  There is a small ventral hernia which contains a short segment
of small bowel, however, there are no findings to suggest bowel
incarceration or obstruction at this time.
2.  Inflammatory changes immediately posterior and inferior to the
head and uncinate process of the pancreas.  There is also a small
duodenal diverticulum of the third portion of the duodenum.  It is
uncertain whether not these inflammatory changes reflect a primary
pancreatic process (i.e., pancreatitis), or a process related to
the duodenal diverticulum (i.e., duodenal diverticulitis).
Clinical correlation and assessment of the lipase/amylase levels is
recommended.
3.  In addition, there appears to be a small amount of biliary
sludge versus small gallstones in the dependent portion of the
gallbladder lumen.  There is also some mild dilatation of the
common bile duct (no intrahepatic biliary ductal dilatation).
Clinical correlation for signs of symptoms of biliary tract
obstruction is recommended, and further evaluation with right upper
quadrant ultrasound may be of use if clinically indicated.
4.  Morphologic changes of cirrhosis with evidence of portal
hypertension (including splenomegaly), as above.
5. Colonic diverticulosis without signs to suggest acute
diverticulitis at this time.
6. Atherosclerosis, including right coronary artery disease. Please
note that although the presence of coronary artery calcium
documents the presence of coronary artery disease, the severity of
this disease and any potential stenosis cannot be assessed on this
non-gated CT examination.  Assessment for potential risk factor
modification, dietary therapy or pharmacologic therapy may be
warranted, if clinically indicated.

## 2014-01-09 ENCOUNTER — Other Ambulatory Visit (INDEPENDENT_AMBULATORY_CARE_PROVIDER_SITE_OTHER): Payer: Self-pay | Admitting: Internal Medicine

## 2014-01-24 LAB — CBC
HCT: 34.7 % — ABNORMAL LOW (ref 36.0–46.0)
Hemoglobin: 11.6 g/dL — ABNORMAL LOW (ref 12.0–15.0)
MCH: 27.2 pg (ref 26.0–34.0)
MCHC: 33.4 g/dL (ref 30.0–36.0)
MCV: 81.3 fL (ref 78.0–100.0)
PLATELETS: 96 10*3/uL — AB (ref 150–400)
RBC: 4.27 MIL/uL (ref 3.87–5.11)
RDW: 16.2 % — AB (ref 11.5–15.5)
WBC: 3.1 10*3/uL — AB (ref 4.0–10.5)

## 2014-01-25 LAB — AFP TUMOR MARKER: AFP-Tumor Marker: 3.1 ng/mL (ref <6.1)

## 2014-02-09 ENCOUNTER — Other Ambulatory Visit (INDEPENDENT_AMBULATORY_CARE_PROVIDER_SITE_OTHER): Payer: Self-pay | Admitting: Internal Medicine

## 2014-02-13 ENCOUNTER — Telehealth (INDEPENDENT_AMBULATORY_CARE_PROVIDER_SITE_OTHER): Payer: Self-pay | Admitting: Internal Medicine

## 2014-02-13 MED ORDER — URSODIOL 300 MG PO CAPS: 300.0000 mg | ORAL_CAPSULE | Freq: Two times a day (BID) | ORAL | Status: DC

## 2014-02-13 MED ORDER — URSODIOL 300 MG PO CAPS: ORAL_CAPSULE | ORAL | Status: DC

## 2014-02-13 NOTE — Telephone Encounter (Signed)
Rx reordered.  

## 2014-03-20 ENCOUNTER — Encounter (INDEPENDENT_AMBULATORY_CARE_PROVIDER_SITE_OTHER): Payer: Self-pay

## 2014-05-22 ENCOUNTER — Ambulatory Visit (INDEPENDENT_AMBULATORY_CARE_PROVIDER_SITE_OTHER): Payer: Medicare Other | Admitting: Internal Medicine

## 2014-05-22 ENCOUNTER — Encounter (INDEPENDENT_AMBULATORY_CARE_PROVIDER_SITE_OTHER): Payer: Self-pay | Admitting: Internal Medicine

## 2014-05-22 VITALS — BP 132/82 | HR 84 | Temp 97.9°F | Resp 18 | Ht 64.0 in | Wt 137.2 lb

## 2014-05-22 DIAGNOSIS — K743 Primary biliary cirrhosis: Secondary | ICD-10-CM

## 2014-05-22 DIAGNOSIS — R112 Nausea with vomiting, unspecified: Secondary | ICD-10-CM

## 2014-05-22 DIAGNOSIS — K219 Gastro-esophageal reflux disease without esophagitis: Secondary | ICD-10-CM

## 2014-05-22 MED ORDER — URSODIOL 300 MG PO CAPS: 300.0000 mg | ORAL_CAPSULE | Freq: Three times a day (TID) | ORAL | Status: DC

## 2014-05-22 MED ORDER — FUROSEMIDE 20 MG PO TABS: 20.0000 mg | ORAL_TABLET | Freq: Every day | ORAL | Status: AC | PRN

## 2014-05-22 NOTE — Patient Instructions (Addendum)
Keep symptom diary as to episodes of nausea vomiting and abdominal pain until next visit. Call any time if these symptoms occur on a frequent basis or every week.

## 2014-05-22 NOTE — Progress Notes (Signed)
Presenting complaint;  Follow-up for PBC and iron deficiency anemia.  Subjective:  Patient is 69 year old Caucasian female who presents for scheduled visit accompanied by her son Suezanne Jacquet. She was last seen 6 months ago. About 3 months ago she developed nausea vomiting and right upper quadrant pain. She was seen by Dr. Manuella Ghazi and underwent upper abdominal ultrasound on 02/16/2014 revealing cirrhotic liver splenomegaly gallstones bile duct measured 8.7 mm and there was no evidence of ascites. Cholecystectomy was advised but she declined stating that she she is too old to have surgery. Since that episode she has had couple more episodes of postprandial nausea vomiting and right upper quadrant pain. These episodes occurred when she is eating fatty or fried foods. She also gives history of sporadic heaving and nausea vomiting even when she is fasting. She states heartburns well controlled with therapy. . She remains with irregular bowel movements. She either has diarrhea and/or constipation. She denies hematemesis melena or frank rectal bleeding. She notices blood in the tissue on straining. When she has diarrhea she takes 2-3 tablets of Imodium. She has lost 11 pounds since the last visit. Her son states that when she eats by herself she does not eat a regular meal and to seat snacks. She eats very well when she eats with friends or family members according to [place]. She's had problems with balance and fell a few weeks ago. She apparently took sleep aid and was drowsy when she woke up. She is taking furosemide only on as-needed basis.   Current Medications: Outpatient Encounter Prescriptions as of 05/22/2014  Medication Sig  . ABILIFY 2 MG tablet Take 10 mg by mouth daily.   [name] amLODipine (NORVASC) 5 MG tablet Take 5 mg by mouth every morning.  [name] b complex vitamins capsule Take 1 capsule by mouth daily.   . bifidobacterium infantis (ALIGN) capsule Take 1 capsule by mouth daily.  . busPIRone (BUSPAR) 10 MG  tablet Take 10 mg by mouth 3 (three) times daily.  . calcium-vitamin D (OSCAL WITH D) 500-200 MG-UNIT per tablet Take 1 tablet by mouth 2 (two) times daily.  . cholecalciferol (VITAMIN D) 1000 UNITS tablet Take 1,000 Units by mouth daily.  . ferrous fumarate (HEMOCYTE - 106 MG FE) 325 (106 FE) MG TABS Take 1 tablet by mouth daily.  [name] FLUoxetine (PROZAC) 20 MG capsule Take 40 mg by mouth daily.  . furosemide (LASIX) 20 MG tablet take 1 tablet by mouth once daily  . hydrOXYzine (VISTARIL) 25 MG capsule Take 25 mg by mouth at bedtime.  . Iron-Vitamins (GERITOL PO) Take 1 tablet by mouth daily.  . lansoprazole (PREVACID) 30 MG capsule take 1 capsule by mouth once daily  . loratadine (CLARITIN) 10 MG tablet Take 10 mg by mouth daily.  [name] LORazepam (ATIVAN) 1 MG tablet Take 1 mg by mouth. Patient takes 1/2 tablet at lunch time , and 1/2 by mouth at bedtime  . metFORMIN (GLUCOPHAGE) 500 MG tablet Take 250 mg by mouth 1 day or 1 dose.  . potassium chloride SA (K-DUR,KLOR-CON) 20 MEQ tablet Take 1 tablet (20 mEq total) by mouth daily as needed. Take for swelling along with Lasix.  [name] silver sulfADIAZINE (SILVADENE) 1 % cream Apply 1 application topically daily as needed. For cracking skin on feet.  . ursodiol (ACTIGALL) 300 MG capsule Take 1 capsule (300 mg total) by mouth 2 (two) times daily.  . ursodiol (ACTIGALL) 300 MG capsule take 1 capsule by mouth three times a day  .  vitamin C (ASCORBIC ACID) 500 MG tablet Take 500 mg by mouth daily.  . Zinc 50 MG CAPS Take 50 mg by mouth daily.  . [DISCONTINUED] FLUoxetine (PROZAC) 40 MG capsule Take 40 mg by mouth every morning.  . [DISCONTINUED] gabapentin (NEURONTIN) 300 MG capsule Take 300 mg by mouth. Per daughter she takes 2 in the morning, 1 at lunch , 1 at 5 pm or 6 pm and 2 at bedtime.     Objective: Blood pressure 132/82, pulse 84, temperature 97.9 F (36.6 C), temperature source Oral, resp. rate 18, height 5\' 4"  (1.626 m), weight 137 lb 3.2 oz  (62.234 kg). Patient is alert and does not have asterixis. Conjunctiva is pink. Sclera is nonicteric She is blind in left eye. Oropharyngeal mucosa is normal. No neck masses or thyromegaly noted. Cardiac exam with regular rhythm normal S1 and S2. No murmur or gallop noted. Lungs are clear to auscultation. Abdomen is fold across upper half. Spleen is easily palpable 4-5 cm below RCM. Liver is also palpable it is firm and nontender. No LE edema or clubbing noted.  Labs/studies Results:  Ultrasound from 02/16/2014 results as above. Lab data from 01/24/2014 CBC 3.1, H&H 11.6 and 34.7 and platelet count 90 6K AFP 3.1.  Assessment:  #1. 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 She appears to be doing well. She has evidence of portal hypertension as evidenced by splenomegaly. Last EGD was over 4 years ago and was negative for varices. She has been reluctant to undergo EGD. She is up-to-date on screening for [place]. #2. History of iron deficiency anemia. Last H&H was just below normal. #3. Thrombocytopenia secondary to cirrhosis and splenomegaly. #4. GERD. Heartburn is well controlled with therapy. #5. Cholelithiasis. She's been known to have cholelithiasis for few years. Intermittent nausea vomiting and abdominal pain possibly secondary to cholelithiasis but need to be absolutely certain before surgery recommended since she is high risk.  Plan:  Patient advised to stay on bland diet. She will keep symptom diary as to frequency of nausea vomiting and abdominal pain. Continue Urso at 300 mg by mouth three times a day. Consider EGD. Patient will think about it. Office visit in 3 months.

## 2014-06-19 ENCOUNTER — Telehealth (INDEPENDENT_AMBULATORY_CARE_PROVIDER_SITE_OTHER): Payer: Self-pay | Admitting: *Deleted

## 2014-06-19 NOTE — Telephone Encounter (Signed)
Patient's Insurance is requesting that a PA be done on the Lansoprazole 30 mg daily. I have called and they are sending a PA to be completed. Patient will be made aware of the outcome as we are updated.

## 2014-08-02 ENCOUNTER — Telehealth (INDEPENDENT_AMBULATORY_CARE_PROVIDER_SITE_OTHER): Payer: Self-pay | Admitting: *Deleted

## 2014-08-02 NOTE — Telephone Encounter (Signed)
Talked with Lamarr Lulas with [facility]. Patient was seen today and she is currently on Ativan 1/2 tablet three times daily. She told Hoyle Sauer that this was not working for her and requested something else, such as Xanax.  Hoyle Sauer is calling today to see what Dr.Rehman thinks about Benzodiazepines and what would be safe for the patient with the history of her liver. Per Dr.Rehman the drug of choice is Lorazepam/Ativan as it doesn't require as much from the liver to break it down. However,if she would want to try a very low dose Xanax that would be okay ,if there are any problems we will need to back off.  Hoyle Sauer was called and made aware.

## 2014-08-28 ENCOUNTER — Ambulatory Visit (INDEPENDENT_AMBULATORY_CARE_PROVIDER_SITE_OTHER): Payer: Medicare Other | Admitting: Internal Medicine

## 2014-09-24 ENCOUNTER — Other Ambulatory Visit: Payer: Self-pay

## 2014-10-18 ENCOUNTER — Encounter (INDEPENDENT_AMBULATORY_CARE_PROVIDER_SITE_OTHER): Payer: Self-pay | Admitting: *Deleted

## 2014-11-15 ENCOUNTER — Telehealth (INDEPENDENT_AMBULATORY_CARE_PROVIDER_SITE_OTHER): Payer: Self-pay | Admitting: *Deleted

## 2014-11-15 NOTE — Telephone Encounter (Signed)
Lattie Haw, daughter, said her mother, Ann Ward, has been complaining of a stomach ache and right side abd pain.  Would like to speak with Tammy if she would please return the call to [phone].

## 2014-11-15 NOTE — Telephone Encounter (Signed)
I talked with Lattie Haw. She stats that her Mom called her last night. She was c/o stomach hurting, right sided pain. Dry heaves and weakness.  Patient is thinks there is a problem with his Gall Stones or her liver.  Per Lattie Haw she has been able to eat toast, boiled eggs, and she had given her an OTC medication for the nausea. She also wondered if her Mom should first follow through with Dr.Rehman or PCP.  Per Dr.Rehman - have patient follow up with PCP , if it is a GI problem her PCP will contact him.

## 2014-12-18 ENCOUNTER — Ambulatory Visit (INDEPENDENT_AMBULATORY_CARE_PROVIDER_SITE_OTHER): Payer: Medicare Other | Admitting: Internal Medicine

## 2014-12-24 ENCOUNTER — Ambulatory Visit (INDEPENDENT_AMBULATORY_CARE_PROVIDER_SITE_OTHER): Payer: Medicare Other | Admitting: Internal Medicine

## 2015-01-10 ENCOUNTER — Ambulatory Visit (INDEPENDENT_AMBULATORY_CARE_PROVIDER_SITE_OTHER): Payer: Medicare Other | Admitting: Internal Medicine

## 2015-01-18 ENCOUNTER — Other Ambulatory Visit (INDEPENDENT_AMBULATORY_CARE_PROVIDER_SITE_OTHER): Payer: Self-pay | Admitting: Internal Medicine

## 2015-01-28 ENCOUNTER — Encounter (INDEPENDENT_AMBULATORY_CARE_PROVIDER_SITE_OTHER): Payer: Self-pay | Admitting: *Deleted

## 2015-01-28 ENCOUNTER — Encounter (INDEPENDENT_AMBULATORY_CARE_PROVIDER_SITE_OTHER): Payer: Self-pay | Admitting: Internal Medicine

## 2015-01-28 ENCOUNTER — Ambulatory Visit (INDEPENDENT_AMBULATORY_CARE_PROVIDER_SITE_OTHER): Payer: Medicare Other | Admitting: Internal Medicine

## 2015-01-28 VITALS — BP 130/76 | HR 72 | Temp 98.3°F | Resp 16 | Ht 64.0 in | Wt 148.5 lb

## 2015-01-28 DIAGNOSIS — K219 Gastro-esophageal reflux disease without esophagitis: Secondary | ICD-10-CM | POA: Diagnosis not present

## 2015-01-28 DIAGNOSIS — K743 Primary biliary cirrhosis: Secondary | ICD-10-CM

## 2015-01-28 DIAGNOSIS — K589 Irritable bowel syndrome without diarrhea: Secondary | ICD-10-CM | POA: Diagnosis not present

## 2015-01-28 MED ORDER — LOPERAMIDE HCL 2 MG PO CAPS: 2.0000 mg | ORAL_CAPSULE | Freq: Every day | ORAL | Status: DC

## 2015-01-28 NOTE — Patient Instructions (Signed)
Physician will call with results of blood tests and ultrasound and completed. Will plan esophagogastroduodenoscopy once above completed.

## 2015-01-28 NOTE — Progress Notes (Signed)
Presenting complaint;  Follow-up for chronic liver disease.  patient complains of diarrhea.  Subjective:   Ann Ward is 68-year-ol Caucasian female who is here for scheduled visit accompanied her son Suezanne Jacquet. She was last see on  05/22/2014.  she complains of diarrhea. Diarrhea started few weeks ago. She has a bowel movement within few minutes of her meals. She has had 3 accidents in the lst 2 we. She noted blood on the tissue few times that she believes s secondary to hemorrhoids. She denies fever nausea or vomiting. She has not taken any anitbiotics in over three months. Stool is usually dark because she is on iron. She complains of abdominal gurgling and sore spot and left midabdomen. She does not have pain with her bowel movements. She takes furosemide occasionally. According to her son she's had few episodes of hypoglycemia. At times she gets upset over little things but she has not been confused. She lives by herself but she has her family and friends and neighbors check on her multiple times daily. She has not had any blood work done since her last visit except hemoglobin A1c by Dr. Manuella Ghazi.    Current Medications: Outpatient Encounter Prescriptions as of 01/28/2015  Medication Sig  . ABILIFY 2 MG tablet Take 10 mg by mouth daily.   [name] amLODipine (NORVASC) 5 MG tablet Take 5 mg by mouth every morning.  [name] b complex vitamins capsule Take 1 capsule by mouth daily.   . bifidobacterium infantis (ALIGN) capsule Take 1 capsule by mouth daily.  . busPIRone (BUSPAR) 10 MG tablet Take 10 mg by mouth 3 (three) times daily.  . calcium-vitamin D (OSCAL WITH D) 500-200 MG-UNIT per tablet Take 1 tablet by mouth 2 (two) times daily.  . cholecalciferol (VITAMIN D) 1000 UNITS tablet Take 1,000 Units by mouth daily.  . ferrous fumarate (HEMOCYTE - 106 MG FE) 325 (106 FE) MG TABS Take 1 tablet by mouth daily.  [name] FLUoxetine (PROZAC) 20 MG capsule Take 40 mg by mouth daily.  . furosemide (LASIX) 20 MG tablet Take  1 tablet (20 mg total) by mouth daily as needed.  . Iron-Vitamins (GERITOL PO) Take 1 tablet by mouth daily.  . lansoprazole (PREVACID) 30 MG capsule take 1 capsule by mouth once daily  . lisinopril (PRINIVIL,ZESTRIL) 2.5 MG tablet Take 2.5 mg by mouth daily.   [name] loratadine (CLARITIN) 10 MG tablet Take 10 mg by mouth daily.  [name] LORazepam (ATIVAN) 1 MG tablet Take by mouth. Patient takes 1/2 tablet at morning ,  1/2 by mouth at evening, 1/2 at bedtime.  . Melatonin 1 MG CAPS Take 1 mg by mouth at bedtime.  . potassium chloride SA (K-DUR,KLOR-CON) 20 MEQ tablet Take 1 tablet (20 mEq total) by mouth daily as needed. Take for swelling along with Lasix.  [name] silver sulfADIAZINE (SILVADENE) 1 % cream Apply 1 application topically daily as needed. For cracking skin on feet.  . ursodiol (ACTIGALL) 300 MG capsule Take 1 capsule (300 mg total) by mouth 3 (three) times daily.  . VESICARE 5 MG tablet Take by mouth daily.   . vitamin C (ASCORBIC ACID) 500 MG tablet Take 500 mg by mouth daily.  . Zinc 50 MG CAPS Take 50 mg by mouth daily.  . metFORMIN (GLUCOPHAGE) 500 MG tablet Take 250 mg by mouth 1 day or 1 dose.  . [DISCONTINUED] hydrOXYzine (VISTARIL) 25 MG capsule Take 25 mg by mouth at bedtime.   No facility-administered encounter medications on file as of 01/28/2015.  Objective: Blood pressure 130/76, pulse 72, temperature 98.3 F (36.8 C), temperature source Oral, resp. rate 16, height _0  (1.626 m), weight 148 lb 8 oz (67.359 kg). Patient is alert and in no acute distress. She does not have asterixis. She is blind in left eye. Conjunctiva is pink. Sclera is nonicteric Oropharyngeal mucosa is normal. No neck masses or thyromegaly noted. Cardiac exam with regular rhythm normal S1 and S2. No murmur or gallop noted. Lungs are clear to auscultation. Abdomen is full with enlarged spleen and firm liver with margin about 7 cm below RCM at midclavicular line. There is no tenderness. Shifting  dullness is absent. No LE edema or clubbing noted.  Labs/studies Results: Last ultrasound was in November 2015 revealing cholelithiasis mildly thickened gallbladder wall, CBD of 8.5 mm cirrhotic liver and splenomegaly.   Lab data from 01/17/2014  WBC 4.4, H&H 11.5 and 34.1 and platelet count 122K  TSH 1.150  BUN 9, creatinine 0.81  Serum sodium 140, potassium 4.1, chloride 97, CO2 27 and calcium 9.3  Bilirubin 0.9, AP 96, AST 18, ALT 11 and albumin 4.2.   AFP was 3.1 on 01/24/2014.  Assessment:  #1. Primary biliary cirrhosis. PBC was diagnosed 11 years ago. She remains with hepatic function. Last EGD was in January 2012 revealing grade 1 esophageal varices. She needs to be reassessed. If varices are larger these need to be banded to prevent variceal bleed. He is due for [facility] screening. #2. Thrombocytopenia secondary to cirrhosis and splenomegaly. #3.  Mild anemia. She has history of iron deficiency anemia. #4.  Diarrhea. Diarrhea appears to be typical of IBS. Last colonoscopy was in November 2011 revealing sigmoid diverticulosis and external hemorrhoids.   Plan:  Patient will go to the lab for CBC, comprehensive chemistry panel and AFP. Upper abdominal ultrasound. Imodium OTC 2 mg by mouth every morning. Esophagogastroduodenoscopy to be scheduled under monitored anesthesia care with esophageal variceal banding if indicated. Office visit in 6 months.

## 2015-01-29 ENCOUNTER — Other Ambulatory Visit (INDEPENDENT_AMBULATORY_CARE_PROVIDER_SITE_OTHER): Payer: Self-pay | Admitting: Internal Medicine

## 2015-01-29 MED ORDER — POTASSIUM CHLORIDE CRYS ER 20 MEQ PO TBCR: 20.0000 meq | EXTENDED_RELEASE_TABLET | Freq: Two times a day (BID) | ORAL | Status: DC

## 2015-01-30 ENCOUNTER — Encounter (INDEPENDENT_AMBULATORY_CARE_PROVIDER_SITE_OTHER): Payer: Self-pay | Admitting: *Deleted

## 2015-01-30 ENCOUNTER — Telehealth (INDEPENDENT_AMBULATORY_CARE_PROVIDER_SITE_OTHER): Payer: Self-pay | Admitting: *Deleted

## 2015-01-30 ENCOUNTER — Other Ambulatory Visit (INDEPENDENT_AMBULATORY_CARE_PROVIDER_SITE_OTHER): Payer: Self-pay | Admitting: *Deleted

## 2015-01-30 DIAGNOSIS — K743 Primary biliary cirrhosis: Secondary | ICD-10-CM

## 2015-01-30 NOTE — Telephone Encounter (Signed)
Per Dr.Rehman the patient will need to have labs drawn in 2 weeks. She will get them done at PCP or at [facility].

## 2015-02-06 ENCOUNTER — Encounter (INDEPENDENT_AMBULATORY_CARE_PROVIDER_SITE_OTHER): Payer: Self-pay

## 2015-02-19 ENCOUNTER — Encounter (INDEPENDENT_AMBULATORY_CARE_PROVIDER_SITE_OTHER): Payer: Self-pay

## 2015-04-09 DIAGNOSIS — F062 Psychotic disorder with delusions due to known physiological condition: Secondary | ICD-10-CM | POA: Diagnosis not present

## 2015-05-06 DIAGNOSIS — Z87891 Personal history of nicotine dependence: Secondary | ICD-10-CM | POA: Diagnosis not present

## 2015-05-06 DIAGNOSIS — Z6825 Body mass index (BMI) 25.0-25.9, adult: Secondary | ICD-10-CM | POA: Diagnosis not present

## 2015-05-06 DIAGNOSIS — R32 Unspecified urinary incontinence: Secondary | ICD-10-CM | POA: Diagnosis not present

## 2015-05-06 DIAGNOSIS — E1142 Type 2 diabetes mellitus with diabetic polyneuropathy: Secondary | ICD-10-CM | POA: Diagnosis not present

## 2015-05-07 DIAGNOSIS — L97521 Non-pressure chronic ulcer of other part of left foot limited to breakdown of skin: Secondary | ICD-10-CM | POA: Diagnosis not present

## 2015-05-07 DIAGNOSIS — B351 Tinea unguium: Secondary | ICD-10-CM | POA: Diagnosis not present

## 2015-05-07 DIAGNOSIS — E1142 Type 2 diabetes mellitus with diabetic polyneuropathy: Secondary | ICD-10-CM | POA: Diagnosis not present

## 2015-05-07 DIAGNOSIS — L851 Acquired keratosis [keratoderma] palmaris et plantaris: Secondary | ICD-10-CM | POA: Diagnosis not present

## 2015-05-13 ENCOUNTER — Other Ambulatory Visit (INDEPENDENT_AMBULATORY_CARE_PROVIDER_SITE_OTHER): Payer: Self-pay | Admitting: Internal Medicine

## 2015-05-21 DIAGNOSIS — L97422 Non-pressure chronic ulcer of left heel and midfoot with fat layer exposed: Secondary | ICD-10-CM | POA: Diagnosis not present

## 2015-05-21 DIAGNOSIS — E1142 Type 2 diabetes mellitus with diabetic polyneuropathy: Secondary | ICD-10-CM | POA: Diagnosis not present

## 2015-06-02 DIAGNOSIS — J449 Chronic obstructive pulmonary disease, unspecified: Secondary | ICD-10-CM | POA: Diagnosis not present

## 2015-06-04 DIAGNOSIS — E1142 Type 2 diabetes mellitus with diabetic polyneuropathy: Secondary | ICD-10-CM | POA: Diagnosis not present

## 2015-06-04 DIAGNOSIS — L97422 Non-pressure chronic ulcer of left heel and midfoot with fat layer exposed: Secondary | ICD-10-CM | POA: Diagnosis not present

## 2015-06-06 DIAGNOSIS — F0390 Unspecified dementia without behavioral disturbance: Secondary | ICD-10-CM | POA: Diagnosis not present

## 2015-06-06 DIAGNOSIS — E119 Type 2 diabetes mellitus without complications: Secondary | ICD-10-CM | POA: Diagnosis not present

## 2015-06-06 DIAGNOSIS — I10 Essential (primary) hypertension: Secondary | ICD-10-CM | POA: Diagnosis not present

## 2015-06-14 DIAGNOSIS — E7800 Pure hypercholesterolemia, unspecified: Secondary | ICD-10-CM | POA: Diagnosis not present

## 2015-06-14 DIAGNOSIS — E1142 Type 2 diabetes mellitus with diabetic polyneuropathy: Secondary | ICD-10-CM | POA: Diagnosis not present

## 2015-06-14 DIAGNOSIS — F329 Major depressive disorder, single episode, unspecified: Secondary | ICD-10-CM | POA: Diagnosis not present

## 2015-06-15 ENCOUNTER — Other Ambulatory Visit (INDEPENDENT_AMBULATORY_CARE_PROVIDER_SITE_OTHER): Payer: Self-pay | Admitting: Internal Medicine

## 2015-06-18 DIAGNOSIS — L97422 Non-pressure chronic ulcer of left heel and midfoot with fat layer exposed: Secondary | ICD-10-CM | POA: Diagnosis not present

## 2015-06-18 DIAGNOSIS — E1142 Type 2 diabetes mellitus with diabetic polyneuropathy: Secondary | ICD-10-CM | POA: Diagnosis not present

## 2015-07-02 DIAGNOSIS — L97422 Non-pressure chronic ulcer of left heel and midfoot with fat layer exposed: Secondary | ICD-10-CM | POA: Diagnosis not present

## 2015-07-02 DIAGNOSIS — E1142 Type 2 diabetes mellitus with diabetic polyneuropathy: Secondary | ICD-10-CM | POA: Diagnosis not present

## 2015-07-03 DIAGNOSIS — J449 Chronic obstructive pulmonary disease, unspecified: Secondary | ICD-10-CM | POA: Diagnosis not present

## 2015-07-08 DIAGNOSIS — F0390 Unspecified dementia without behavioral disturbance: Secondary | ICD-10-CM | POA: Diagnosis not present

## 2015-07-08 DIAGNOSIS — E119 Type 2 diabetes mellitus without complications: Secondary | ICD-10-CM | POA: Diagnosis not present

## 2015-07-08 DIAGNOSIS — I10 Essential (primary) hypertension: Secondary | ICD-10-CM | POA: Diagnosis not present

## 2015-07-22 DIAGNOSIS — R350 Frequency of micturition: Secondary | ICD-10-CM | POA: Diagnosis not present

## 2015-07-22 DIAGNOSIS — E1142 Type 2 diabetes mellitus with diabetic polyneuropathy: Secondary | ICD-10-CM | POA: Diagnosis not present

## 2015-07-22 DIAGNOSIS — N390 Urinary tract infection, site not specified: Secondary | ICD-10-CM | POA: Diagnosis not present

## 2015-07-22 DIAGNOSIS — F329 Major depressive disorder, single episode, unspecified: Secondary | ICD-10-CM | POA: Diagnosis not present

## 2015-07-23 DIAGNOSIS — E1142 Type 2 diabetes mellitus with diabetic polyneuropathy: Secondary | ICD-10-CM | POA: Diagnosis not present

## 2015-07-23 DIAGNOSIS — F062 Psychotic disorder with delusions due to known physiological condition: Secondary | ICD-10-CM | POA: Diagnosis not present

## 2015-07-23 DIAGNOSIS — L97422 Non-pressure chronic ulcer of left heel and midfoot with fat layer exposed: Secondary | ICD-10-CM | POA: Diagnosis not present

## 2015-07-25 DIAGNOSIS — N390 Urinary tract infection, site not specified: Secondary | ICD-10-CM | POA: Diagnosis not present

## 2015-07-25 DIAGNOSIS — E119 Type 2 diabetes mellitus without complications: Secondary | ICD-10-CM | POA: Diagnosis not present

## 2015-07-25 DIAGNOSIS — R112 Nausea with vomiting, unspecified: Secondary | ICD-10-CM | POA: Diagnosis not present

## 2015-07-26 DIAGNOSIS — K745 Biliary cirrhosis, unspecified: Secondary | ICD-10-CM | POA: Diagnosis not present

## 2015-07-26 DIAGNOSIS — Z7984 Long term (current) use of oral hypoglycemic drugs: Secondary | ICD-10-CM | POA: Diagnosis not present

## 2015-07-26 DIAGNOSIS — I10 Essential (primary) hypertension: Secondary | ICD-10-CM | POA: Diagnosis not present

## 2015-07-26 DIAGNOSIS — R161 Splenomegaly, not elsewhere classified: Secondary | ICD-10-CM | POA: Diagnosis not present

## 2015-07-26 DIAGNOSIS — R112 Nausea with vomiting, unspecified: Secondary | ICD-10-CM | POA: Diagnosis not present

## 2015-07-26 DIAGNOSIS — E119 Type 2 diabetes mellitus without complications: Secondary | ICD-10-CM | POA: Diagnosis not present

## 2015-07-26 DIAGNOSIS — F17200 Nicotine dependence, unspecified, uncomplicated: Secondary | ICD-10-CM | POA: Diagnosis not present

## 2015-07-26 DIAGNOSIS — N390 Urinary tract infection, site not specified: Secondary | ICD-10-CM | POA: Diagnosis not present

## 2015-07-26 DIAGNOSIS — Z79899 Other long term (current) drug therapy: Secondary | ICD-10-CM | POA: Diagnosis not present

## 2015-07-26 DIAGNOSIS — Z853 Personal history of malignant neoplasm of breast: Secondary | ICD-10-CM | POA: Diagnosis not present

## 2015-07-26 DIAGNOSIS — K8020 Calculus of gallbladder without cholecystitis without obstruction: Secondary | ICD-10-CM | POA: Diagnosis not present

## 2015-07-27 DIAGNOSIS — K8020 Calculus of gallbladder without cholecystitis without obstruction: Secondary | ICD-10-CM | POA: Diagnosis not present

## 2015-07-27 DIAGNOSIS — E119 Type 2 diabetes mellitus without complications: Secondary | ICD-10-CM | POA: Diagnosis not present

## 2015-07-27 DIAGNOSIS — N390 Urinary tract infection, site not specified: Secondary | ICD-10-CM | POA: Diagnosis not present

## 2015-07-30 ENCOUNTER — Encounter (INDEPENDENT_AMBULATORY_CARE_PROVIDER_SITE_OTHER): Payer: Self-pay | Admitting: Internal Medicine

## 2015-07-30 ENCOUNTER — Ambulatory Visit (HOSPITAL_COMMUNITY)
Admission: RE | Admit: 2015-07-30 | Discharge: 2015-07-30 | Disposition: A | Discharge status: Home / Self Care | Payer: PPO | Source: Ambulatory Visit | Attending: Internal Medicine | Admitting: Internal Medicine

## 2015-07-30 ENCOUNTER — Ambulatory Visit (INDEPENDENT_AMBULATORY_CARE_PROVIDER_SITE_OTHER): Payer: PPO | Admitting: Internal Medicine

## 2015-07-30 VITALS — BP 130/76 | HR 90 | Temp 98.6°F | Resp 20 | Ht 64.0 in | Wt 153.7 lb

## 2015-07-30 DIAGNOSIS — K219 Gastro-esophageal reflux disease without esophagitis: Secondary | ICD-10-CM

## 2015-07-30 DIAGNOSIS — R05 Cough: Secondary | ICD-10-CM | POA: Insufficient documentation

## 2015-07-30 DIAGNOSIS — K589 Irritable bowel syndrome without diarrhea: Secondary | ICD-10-CM | POA: Diagnosis not present

## 2015-07-30 DIAGNOSIS — R197 Diarrhea, unspecified: Secondary | ICD-10-CM

## 2015-07-30 DIAGNOSIS — R059 Cough, unspecified: Secondary | ICD-10-CM

## 2015-07-30 DIAGNOSIS — R600 Localized edema: Secondary | ICD-10-CM

## 2015-07-30 DIAGNOSIS — K743 Primary biliary cirrhosis: Secondary | ICD-10-CM | POA: Diagnosis not present

## 2015-07-30 DIAGNOSIS — R110 Nausea: Secondary | ICD-10-CM

## 2015-07-30 MED ORDER — LEVOFLOXACIN 500 MG PO TABS: 500.0000 mg | ORAL_TABLET | Freq: Every day | ORAL | Status: DC

## 2015-07-30 MED ORDER — ONDANSETRON HCL 4 MG PO TABS: 4.0000 mg | ORAL_TABLET | Freq: Three times a day (TID) | ORAL | Status: DC | PRN

## 2015-07-30 MED ORDER — LOPERAMIDE HCL 2 MG PO CAPS: 2.0000 mg | ORAL_CAPSULE | Freq: Four times a day (QID) | ORAL | Status: DC | PRN

## 2015-07-30 NOTE — Patient Instructions (Signed)
Physician will call with results of Doppler and chest x-ray.

## 2015-07-30 NOTE — Progress Notes (Signed)
Presenting complaint;  Follow-up for chronic liver disease. Patient has non-GI symptoms.  Subjective:  Patient is 70 year old Caucasian female who is here for scheduled visit accompanied by her son been. She was last seen on 01/28/2015 and weight 148 pounds. Today she was 153 pounds. She has not been feeling well for over a week. Weekend before last she became somewhat incoherent. She was seen by Dr. Manuella Ghazi 8 days ago and felt to have urinary tract infection and begun on Cipro. She'll return for follow-up visit in 07/25/2015 and was hospitalized at [facility] for right-sided abdominal pain and nausea for 2 days. He was treated with Levaquin. She was seen in consultation by Dr. Anthony Sar who felt she did not need cholecystectomy. Patient now presents with productive cough. Sputum is yellow and at times blood-tinged. She denies fever or chills. She remains with intermittent nausea. She feels better after eating. She denies vomiting or dysphagia. She feels heartburns well controlled with therapy. Lately she's been having multiple stools per day. She has had accidents during the day and night. She denies melena or rectal bleeding. According to her son been she has been consuming large doses of Imodium. She apparently thought she could take up to 14 tablets per day. She has noted shortness of breath with physical activity. She denies chest pain.   Current Medications: Outpatient Encounter Prescriptions as of 07/30/2015  Medication Sig  . ABILIFY 2 MG tablet Take 10 mg by mouth daily.   [name] amLODipine (NORVASC) 5 MG tablet Take 5 mg by mouth every morning.  [name] b complex vitamins capsule Take 1 capsule by mouth daily.   . bifidobacterium infantis (ALIGN) capsule Take 1 capsule by mouth daily.  . calcium-vitamin D (OSCAL WITH D) 500-200 MG-UNIT per tablet Take 1 tablet by mouth 2 (two) times daily.  . cholecalciferol (VITAMIN D) 1000 UNITS tablet Take 1,000 Units by mouth daily.  . ferrous fumarate (HEMOCYTE - 106 MG  FE) 325 (106 FE) MG TABS Take 1 tablet by mouth daily.  [name] FLUoxetine (PROZAC) 20 MG capsule Take 40 mg by mouth daily.  [name] FLUZONE QUADRIVALENT injection Inject 0.5 mLs into the muscle.   . furosemide (LASIX) 20 MG tablet Take 1 tablet (20 mg total) by mouth daily as needed.  . Iron-Vitamins (GERITOL PO) Take 1 tablet by mouth daily.  . lansoprazole (PREVACID) 30 MG capsule take 1 capsule by mouth once daily  . lisinopril (PRINIVIL,ZESTRIL) 2.5 MG tablet Take 2.5 mg by mouth daily.   [name] loperamide (IMODIUM) 2 MG capsule Take 1 capsule (2 mg total) by mouth daily before breakfast.  . loratadine (CLARITIN) 10 MG tablet Take 10 mg by mouth daily.  [name] LORazepam (ATIVAN) 1 MG tablet Take 1 mg by mouth. Patient takes 1/2 tablet at morning ,  1/2 by mouth at evening, 1/2 at bedtime.  [name] LYRICA 100 MG capsule Take 100 mg by mouth daily.   . Melatonin 1 MG CAPS Take 1 mg by mouth at bedtime.  . potassium chloride SA (K-DUR,KLOR-CON) 20 MEQ tablet take 1 tablet by mouth twice a day for 1 week then take 1 tablet by mouth once daily THEREAFTER  . pravastatin (PRAVACHOL) 20 MG tablet Take 20 mg by mouth daily.   . silver sulfADIAZINE (SILVADENE) 1 % cream Apply 1 application topically daily as needed. For cracking skin on feet.  . ursodiol (ACTIGALL) 300 MG capsule take 1 capsule by mouth three times a day  . VESICARE 5 MG tablet Take by mouth  daily.   . vitamin C (ASCORBIC ACID) 500 MG tablet Take 500 mg by mouth daily.  . Zinc 50 MG CAPS Take 50 mg by mouth daily.  . busPIRone (BUSPAR) 10 MG tablet Take 10 mg by mouth 3 (three) times daily.  . [DISCONTINUED] metFORMIN (GLUCOPHAGE) 500 MG tablet Take 250 mg by mouth 1 day or 1 dose. Reported on 07/30/2015   No facility-administered encounter medications on file as of 07/30/2015.     Objective: Blood pressure 130/76, pulse 90, temperature 98.6 F (37 C), temperature source Oral, resp. rate 20, height 5\' 4"  (1.626 m), weight 153 lb 11.2 oz (69.718 kg).   Patient is alert and appears to be sick. She does not have asterixis. Conjunctiva was pink. Sclerae nonicteric. Patient is blind in left eye. Conjunctiva is pink. Sclera is nonicteric Oropharyngeal mucosa is normal. No neck masses or thyromegaly noted. Cardiac exam with regular rhythm normal S1 and S2. No murmur or gallop noted. Auscultation of lungs revealed few rhonchi. Abdomen abdomen is full with easily palpable spleen. Liver is also a large is about 6 cm below RCM. Liver is firm but not tender. Shifting dullness is absent. Mild tenderness noted in right upper quadrant. No LE edema or clubbing noted.  Labs/studies Results: Lab data from 07/25/2015 WBC 4.6, H&H 11.2 and 35.9 and platelet count 108K Urinalysis positive for nitrites, leukocyte esterase and revealed 10-20 WBCs. Serum creatinine 0.55 Bilirubin 0.7, AP 99, AST 15.5, ALT 8 and albumin 3.66.  Ultrasound from [facility] 20 11/17/2015 Gallstones with mild gallbladder wall thickening and small pericholecystic fluid. CBD is normal. Nodular liver contour and increased echogenicity. Splenomegaly. Mild right hydronephrosis.    Assessment:  #1. Productive cough. Sputum is blood-tinged. She is afebrile. Her symptoms are suggestive of acute bronchitis. She could also have pneumonia. With edema to left leg and calf tenderness DVT/PE needs to be ruled out. #2. Primary biliary cirrhosis. Ultrasound last week negative for suspicious lesions cirrhotic liver. No evidence of ascites. #3. Nausea. Nausea may be secondary to one from medications and less likely due to gallbladder disease since she feels better after eating. Will treat with ondansetron and monitor symptoms. #4. PBC. She has cirrhosis and hepatic function remains well preserved. #5. Acute on chronic diarrhea. She has history of IBS. I doubt that she has C. difficile colitis was diarrhea started before she went on antibiotic. Nevertheless will check for C. difficile colitis. #6. GERD.  GERD symptoms are well controlled with therapy. #7. Cholelithiasis. She's had cholelithiasis for several years. If nausea persists will consider HIDA scan.  Plan:  Patient will go to [facility] radiology for chest film and left leg Doppler ultrasound as discussed with Dr. Manuella Ghazi patient's PCP Levaquin 500 mg by mouth daily for 1 week. Patient advised to take loperamide 2 mg up to 4 times a day as needed. Ondansetron 4 mg by mouth 3 times a day when necessary. Stool for C. difficile by PCR. Follow-up with Dr. Manuella Ghazi later this week as planned. Office visit in 6 months

## 2015-08-02 DIAGNOSIS — E1142 Type 2 diabetes mellitus with diabetic polyneuropathy: Secondary | ICD-10-CM | POA: Diagnosis not present

## 2015-08-02 DIAGNOSIS — J449 Chronic obstructive pulmonary disease, unspecified: Secondary | ICD-10-CM | POA: Diagnosis not present

## 2015-08-05 ENCOUNTER — Other Ambulatory Visit (INDEPENDENT_AMBULATORY_CARE_PROVIDER_SITE_OTHER): Payer: Self-pay | Admitting: Internal Medicine

## 2015-08-05 DIAGNOSIS — F329 Major depressive disorder, single episode, unspecified: Secondary | ICD-10-CM | POA: Diagnosis not present

## 2015-08-05 DIAGNOSIS — E1142 Type 2 diabetes mellitus with diabetic polyneuropathy: Secondary | ICD-10-CM | POA: Diagnosis not present

## 2015-08-05 DIAGNOSIS — R609 Edema, unspecified: Secondary | ICD-10-CM | POA: Diagnosis not present

## 2015-08-05 DIAGNOSIS — Z299 Encounter for prophylactic measures, unspecified: Secondary | ICD-10-CM | POA: Diagnosis not present

## 2015-08-05 DIAGNOSIS — R197 Diarrhea, unspecified: Secondary | ICD-10-CM | POA: Diagnosis not present

## 2015-08-05 DIAGNOSIS — E7800 Pure hypercholesterolemia, unspecified: Secondary | ICD-10-CM | POA: Diagnosis not present

## 2015-08-06 LAB — C. DIFFICILE GDH AND TOXIN A/B
C. difficile GDH: NOT DETECTED
C. difficile Toxin A/B: NOT DETECTED

## 2015-08-06 LAB — CLOSTRIDIUM DIFFICILE BY PCR: Toxigenic C. Difficile by PCR: NOT DETECTED

## 2015-08-07 ENCOUNTER — Encounter (INDEPENDENT_AMBULATORY_CARE_PROVIDER_SITE_OTHER): Payer: Self-pay

## 2015-08-15 DIAGNOSIS — I10 Essential (primary) hypertension: Secondary | ICD-10-CM | POA: Diagnosis not present

## 2015-08-15 DIAGNOSIS — E119 Type 2 diabetes mellitus without complications: Secondary | ICD-10-CM | POA: Diagnosis not present

## 2015-08-15 DIAGNOSIS — F0390 Unspecified dementia without behavioral disturbance: Secondary | ICD-10-CM | POA: Diagnosis not present

## 2015-09-02 DIAGNOSIS — J449 Chronic obstructive pulmonary disease, unspecified: Secondary | ICD-10-CM | POA: Diagnosis not present

## 2015-09-10 DIAGNOSIS — L97422 Non-pressure chronic ulcer of left heel and midfoot with fat layer exposed: Secondary | ICD-10-CM | POA: Diagnosis not present

## 2015-09-10 DIAGNOSIS — B351 Tinea unguium: Secondary | ICD-10-CM | POA: Diagnosis not present

## 2015-09-10 DIAGNOSIS — L851 Acquired keratosis [keratoderma] palmaris et plantaris: Secondary | ICD-10-CM | POA: Diagnosis not present

## 2015-09-10 DIAGNOSIS — L97512 Non-pressure chronic ulcer of other part of right foot with fat layer exposed: Secondary | ICD-10-CM | POA: Diagnosis not present

## 2015-09-10 DIAGNOSIS — E1142 Type 2 diabetes mellitus with diabetic polyneuropathy: Secondary | ICD-10-CM | POA: Diagnosis not present

## 2015-09-19 DIAGNOSIS — Z1389 Encounter for screening for other disorder: Secondary | ICD-10-CM | POA: Diagnosis not present

## 2015-09-19 DIAGNOSIS — Z299 Encounter for prophylactic measures, unspecified: Secondary | ICD-10-CM | POA: Diagnosis not present

## 2015-09-19 DIAGNOSIS — Z6826 Body mass index (BMI) 26.0-26.9, adult: Secondary | ICD-10-CM | POA: Diagnosis not present

## 2015-09-19 DIAGNOSIS — F329 Major depressive disorder, single episode, unspecified: Secondary | ICD-10-CM | POA: Diagnosis not present

## 2015-09-19 DIAGNOSIS — R350 Frequency of micturition: Secondary | ICD-10-CM | POA: Diagnosis not present

## 2015-09-19 DIAGNOSIS — Z7189 Other specified counseling: Secondary | ICD-10-CM | POA: Diagnosis not present

## 2015-09-19 DIAGNOSIS — Z1211 Encounter for screening for malignant neoplasm of colon: Secondary | ICD-10-CM | POA: Diagnosis not present

## 2015-09-19 DIAGNOSIS — Z0000 Encounter for general adult medical examination without abnormal findings: Secondary | ICD-10-CM | POA: Diagnosis not present

## 2015-09-24 DIAGNOSIS — E1142 Type 2 diabetes mellitus with diabetic polyneuropathy: Secondary | ICD-10-CM | POA: Diagnosis not present

## 2015-09-24 DIAGNOSIS — R5383 Other fatigue: Secondary | ICD-10-CM | POA: Diagnosis not present

## 2015-09-24 DIAGNOSIS — E7800 Pure hypercholesterolemia, unspecified: Secondary | ICD-10-CM | POA: Diagnosis not present

## 2015-09-24 DIAGNOSIS — E559 Vitamin D deficiency, unspecified: Secondary | ICD-10-CM | POA: Diagnosis not present

## 2015-09-24 DIAGNOSIS — L97512 Non-pressure chronic ulcer of other part of right foot with fat layer exposed: Secondary | ICD-10-CM | POA: Diagnosis not present

## 2015-10-02 DIAGNOSIS — J449 Chronic obstructive pulmonary disease, unspecified: Secondary | ICD-10-CM | POA: Diagnosis not present

## 2015-10-08 DIAGNOSIS — L97512 Non-pressure chronic ulcer of other part of right foot with fat layer exposed: Secondary | ICD-10-CM | POA: Diagnosis not present

## 2015-10-08 DIAGNOSIS — E1142 Type 2 diabetes mellitus with diabetic polyneuropathy: Secondary | ICD-10-CM | POA: Diagnosis not present

## 2015-10-17 DIAGNOSIS — E2839 Other primary ovarian failure: Secondary | ICD-10-CM | POA: Diagnosis not present

## 2015-10-21 DIAGNOSIS — F0390 Unspecified dementia without behavioral disturbance: Secondary | ICD-10-CM | POA: Diagnosis not present

## 2015-10-21 DIAGNOSIS — E119 Type 2 diabetes mellitus without complications: Secondary | ICD-10-CM | POA: Diagnosis not present

## 2015-10-21 DIAGNOSIS — I10 Essential (primary) hypertension: Secondary | ICD-10-CM | POA: Diagnosis not present

## 2015-10-22 DIAGNOSIS — L0390 Cellulitis, unspecified: Secondary | ICD-10-CM | POA: Diagnosis not present

## 2015-10-22 DIAGNOSIS — L97521 Non-pressure chronic ulcer of other part of left foot limited to breakdown of skin: Secondary | ICD-10-CM | POA: Diagnosis not present

## 2015-10-22 DIAGNOSIS — E1142 Type 2 diabetes mellitus with diabetic polyneuropathy: Secondary | ICD-10-CM | POA: Diagnosis not present

## 2015-10-22 DIAGNOSIS — L97512 Non-pressure chronic ulcer of other part of right foot with fat layer exposed: Secondary | ICD-10-CM | POA: Diagnosis not present

## 2015-10-26 ENCOUNTER — Other Ambulatory Visit (INDEPENDENT_AMBULATORY_CARE_PROVIDER_SITE_OTHER): Payer: Self-pay | Admitting: Internal Medicine

## 2015-10-29 DIAGNOSIS — E1142 Type 2 diabetes mellitus with diabetic polyneuropathy: Secondary | ICD-10-CM | POA: Diagnosis not present

## 2015-10-29 DIAGNOSIS — L97512 Non-pressure chronic ulcer of other part of right foot with fat layer exposed: Secondary | ICD-10-CM | POA: Diagnosis not present

## 2015-11-05 DIAGNOSIS — E1142 Type 2 diabetes mellitus with diabetic polyneuropathy: Secondary | ICD-10-CM | POA: Diagnosis not present

## 2015-11-05 DIAGNOSIS — L97512 Non-pressure chronic ulcer of other part of right foot with fat layer exposed: Secondary | ICD-10-CM | POA: Diagnosis not present

## 2015-11-12 DIAGNOSIS — L97512 Non-pressure chronic ulcer of other part of right foot with fat layer exposed: Secondary | ICD-10-CM | POA: Diagnosis not present

## 2015-11-12 DIAGNOSIS — E1142 Type 2 diabetes mellitus with diabetic polyneuropathy: Secondary | ICD-10-CM | POA: Diagnosis not present

## 2015-11-13 DIAGNOSIS — E1142 Type 2 diabetes mellitus with diabetic polyneuropathy: Secondary | ICD-10-CM | POA: Diagnosis not present

## 2015-11-13 DIAGNOSIS — E7800 Pure hypercholesterolemia, unspecified: Secondary | ICD-10-CM | POA: Diagnosis not present

## 2015-11-13 DIAGNOSIS — D473 Essential (hemorrhagic) thrombocythemia: Secondary | ICD-10-CM | POA: Diagnosis not present

## 2015-11-13 DIAGNOSIS — F329 Major depressive disorder, single episode, unspecified: Secondary | ICD-10-CM | POA: Diagnosis not present

## 2015-11-19 DIAGNOSIS — L97512 Non-pressure chronic ulcer of other part of right foot with fat layer exposed: Secondary | ICD-10-CM | POA: Diagnosis not present

## 2015-11-19 DIAGNOSIS — E1142 Type 2 diabetes mellitus with diabetic polyneuropathy: Secondary | ICD-10-CM | POA: Diagnosis not present

## 2015-11-26 DIAGNOSIS — E1142 Type 2 diabetes mellitus with diabetic polyneuropathy: Secondary | ICD-10-CM | POA: Diagnosis not present

## 2015-11-26 DIAGNOSIS — L97512 Non-pressure chronic ulcer of other part of right foot with fat layer exposed: Secondary | ICD-10-CM | POA: Diagnosis not present

## 2015-11-27 DIAGNOSIS — E1142 Type 2 diabetes mellitus with diabetic polyneuropathy: Secondary | ICD-10-CM | POA: Diagnosis not present

## 2015-11-27 DIAGNOSIS — L97512 Non-pressure chronic ulcer of other part of right foot with fat layer exposed: Secondary | ICD-10-CM | POA: Diagnosis not present

## 2015-12-03 DIAGNOSIS — E1142 Type 2 diabetes mellitus with diabetic polyneuropathy: Secondary | ICD-10-CM | POA: Diagnosis not present

## 2015-12-03 DIAGNOSIS — E11610 Type 2 diabetes mellitus with diabetic neuropathic arthropathy: Secondary | ICD-10-CM | POA: Diagnosis not present

## 2015-12-03 DIAGNOSIS — L97512 Non-pressure chronic ulcer of other part of right foot with fat layer exposed: Secondary | ICD-10-CM | POA: Diagnosis not present

## 2015-12-11 DIAGNOSIS — K219 Gastro-esophageal reflux disease without esophagitis: Secondary | ICD-10-CM | POA: Diagnosis not present

## 2015-12-11 DIAGNOSIS — E1140 Type 2 diabetes mellitus with diabetic neuropathy, unspecified: Secondary | ICD-10-CM | POA: Diagnosis not present

## 2015-12-11 DIAGNOSIS — L97519 Non-pressure chronic ulcer of other part of right foot with unspecified severity: Secondary | ICD-10-CM | POA: Diagnosis not present

## 2015-12-11 DIAGNOSIS — E7800 Pure hypercholesterolemia, unspecified: Secondary | ICD-10-CM | POA: Diagnosis not present

## 2015-12-11 DIAGNOSIS — F418 Other specified anxiety disorders: Secondary | ICD-10-CM | POA: Diagnosis not present

## 2015-12-11 DIAGNOSIS — I10 Essential (primary) hypertension: Secondary | ICD-10-CM | POA: Diagnosis not present

## 2015-12-11 DIAGNOSIS — Z79899 Other long term (current) drug therapy: Secondary | ICD-10-CM | POA: Diagnosis not present

## 2015-12-11 DIAGNOSIS — L97514 Non-pressure chronic ulcer of other part of right foot with necrosis of bone: Secondary | ICD-10-CM | POA: Diagnosis not present

## 2015-12-11 DIAGNOSIS — E11621 Type 2 diabetes mellitus with foot ulcer: Secondary | ICD-10-CM | POA: Diagnosis not present

## 2015-12-16 DIAGNOSIS — L97519 Non-pressure chronic ulcer of other part of right foot with unspecified severity: Secondary | ICD-10-CM | POA: Diagnosis not present

## 2015-12-17 DIAGNOSIS — R600 Localized edema: Secondary | ICD-10-CM | POA: Diagnosis not present

## 2015-12-17 DIAGNOSIS — E1142 Type 2 diabetes mellitus with diabetic polyneuropathy: Secondary | ICD-10-CM | POA: Diagnosis not present

## 2015-12-17 DIAGNOSIS — F062 Psychotic disorder with delusions due to known physiological condition: Secondary | ICD-10-CM | POA: Diagnosis not present

## 2015-12-18 DIAGNOSIS — E11621 Type 2 diabetes mellitus with foot ulcer: Secondary | ICD-10-CM | POA: Diagnosis not present

## 2015-12-18 DIAGNOSIS — L97514 Non-pressure chronic ulcer of other part of right foot with necrosis of bone: Secondary | ICD-10-CM | POA: Diagnosis not present

## 2015-12-23 DIAGNOSIS — L03115 Cellulitis of right lower limb: Secondary | ICD-10-CM | POA: Diagnosis not present

## 2015-12-23 DIAGNOSIS — Z79899 Other long term (current) drug therapy: Secondary | ICD-10-CM | POA: Diagnosis not present

## 2015-12-23 DIAGNOSIS — L97519 Non-pressure chronic ulcer of other part of right foot with unspecified severity: Secondary | ICD-10-CM | POA: Diagnosis not present

## 2015-12-23 DIAGNOSIS — E11621 Type 2 diabetes mellitus with foot ulcer: Secondary | ICD-10-CM | POA: Diagnosis not present

## 2015-12-23 DIAGNOSIS — A4901 Methicillin susceptible Staphylococcus aureus infection, unspecified site: Secondary | ICD-10-CM | POA: Diagnosis not present

## 2015-12-26 DIAGNOSIS — E1140 Type 2 diabetes mellitus with diabetic neuropathy, unspecified: Secondary | ICD-10-CM | POA: Diagnosis not present

## 2015-12-26 DIAGNOSIS — L97514 Non-pressure chronic ulcer of other part of right foot with necrosis of bone: Secondary | ICD-10-CM | POA: Diagnosis not present

## 2015-12-26 DIAGNOSIS — E11621 Type 2 diabetes mellitus with foot ulcer: Secondary | ICD-10-CM | POA: Diagnosis not present

## 2015-12-26 DIAGNOSIS — I10 Essential (primary) hypertension: Secondary | ICD-10-CM | POA: Diagnosis not present

## 2015-12-26 DIAGNOSIS — K219 Gastro-esophageal reflux disease without esophagitis: Secondary | ICD-10-CM | POA: Diagnosis not present

## 2015-12-26 DIAGNOSIS — Z79899 Other long term (current) drug therapy: Secondary | ICD-10-CM | POA: Diagnosis not present

## 2015-12-26 DIAGNOSIS — L97519 Non-pressure chronic ulcer of other part of right foot with unspecified severity: Secondary | ICD-10-CM | POA: Diagnosis not present

## 2015-12-26 DIAGNOSIS — F418 Other specified anxiety disorders: Secondary | ICD-10-CM | POA: Diagnosis not present

## 2015-12-26 DIAGNOSIS — E7800 Pure hypercholesterolemia, unspecified: Secondary | ICD-10-CM | POA: Diagnosis not present

## 2015-12-27 DIAGNOSIS — L02611 Cutaneous abscess of right foot: Secondary | ICD-10-CM | POA: Diagnosis not present

## 2015-12-27 DIAGNOSIS — A4902 Methicillin resistant Staphylococcus aureus infection, unspecified site: Secondary | ICD-10-CM | POA: Diagnosis not present

## 2015-12-27 DIAGNOSIS — L97519 Non-pressure chronic ulcer of other part of right foot with unspecified severity: Secondary | ICD-10-CM | POA: Diagnosis not present

## 2015-12-27 DIAGNOSIS — Z853 Personal history of malignant neoplasm of breast: Secondary | ICD-10-CM | POA: Diagnosis not present

## 2015-12-27 DIAGNOSIS — I10 Essential (primary) hypertension: Secondary | ICD-10-CM | POA: Diagnosis not present

## 2015-12-27 DIAGNOSIS — J441 Chronic obstructive pulmonary disease with (acute) exacerbation: Secondary | ICD-10-CM | POA: Diagnosis not present

## 2015-12-27 DIAGNOSIS — E11621 Type 2 diabetes mellitus with foot ulcer: Secondary | ICD-10-CM | POA: Diagnosis not present

## 2015-12-27 DIAGNOSIS — Z7984 Long term (current) use of oral hypoglycemic drugs: Secondary | ICD-10-CM | POA: Diagnosis not present

## 2015-12-27 DIAGNOSIS — Z4800 Encounter for change or removal of nonsurgical wound dressing: Secondary | ICD-10-CM | POA: Diagnosis not present

## 2015-12-27 DIAGNOSIS — Z9981 Dependence on supplemental oxygen: Secondary | ICD-10-CM | POA: Diagnosis not present

## 2015-12-27 DIAGNOSIS — L03115 Cellulitis of right lower limb: Secondary | ICD-10-CM | POA: Diagnosis not present

## 2015-12-27 DIAGNOSIS — F17210 Nicotine dependence, cigarettes, uncomplicated: Secondary | ICD-10-CM | POA: Diagnosis not present

## 2015-12-29 DIAGNOSIS — L97519 Non-pressure chronic ulcer of other part of right foot with unspecified severity: Secondary | ICD-10-CM | POA: Diagnosis not present

## 2015-12-29 DIAGNOSIS — E11621 Type 2 diabetes mellitus with foot ulcer: Secondary | ICD-10-CM | POA: Diagnosis not present

## 2015-12-29 DIAGNOSIS — Z9981 Dependence on supplemental oxygen: Secondary | ICD-10-CM | POA: Diagnosis not present

## 2015-12-29 DIAGNOSIS — F17210 Nicotine dependence, cigarettes, uncomplicated: Secondary | ICD-10-CM | POA: Diagnosis not present

## 2015-12-29 DIAGNOSIS — Z853 Personal history of malignant neoplasm of breast: Secondary | ICD-10-CM | POA: Diagnosis not present

## 2015-12-29 DIAGNOSIS — L03115 Cellulitis of right lower limb: Secondary | ICD-10-CM | POA: Diagnosis not present

## 2015-12-29 DIAGNOSIS — Z7984 Long term (current) use of oral hypoglycemic drugs: Secondary | ICD-10-CM | POA: Diagnosis not present

## 2015-12-29 DIAGNOSIS — J441 Chronic obstructive pulmonary disease with (acute) exacerbation: Secondary | ICD-10-CM | POA: Diagnosis not present

## 2015-12-29 DIAGNOSIS — I10 Essential (primary) hypertension: Secondary | ICD-10-CM | POA: Diagnosis not present

## 2015-12-29 DIAGNOSIS — L02611 Cutaneous abscess of right foot: Secondary | ICD-10-CM | POA: Diagnosis not present

## 2015-12-29 DIAGNOSIS — Z4800 Encounter for change or removal of nonsurgical wound dressing: Secondary | ICD-10-CM | POA: Diagnosis not present

## 2015-12-29 DIAGNOSIS — A4902 Methicillin resistant Staphylococcus aureus infection, unspecified site: Secondary | ICD-10-CM | POA: Diagnosis not present

## 2015-12-29 HISTORY — PX: DG GREAT TOE RIGHT FOOT: HXRAD1657

## 2015-12-30 DIAGNOSIS — Z7984 Long term (current) use of oral hypoglycemic drugs: Secondary | ICD-10-CM | POA: Diagnosis not present

## 2015-12-30 DIAGNOSIS — Z9981 Dependence on supplemental oxygen: Secondary | ICD-10-CM | POA: Diagnosis not present

## 2015-12-30 DIAGNOSIS — L02611 Cutaneous abscess of right foot: Secondary | ICD-10-CM | POA: Diagnosis not present

## 2015-12-30 DIAGNOSIS — Z4800 Encounter for change or removal of nonsurgical wound dressing: Secondary | ICD-10-CM | POA: Diagnosis not present

## 2015-12-30 DIAGNOSIS — L97519 Non-pressure chronic ulcer of other part of right foot with unspecified severity: Secondary | ICD-10-CM | POA: Diagnosis not present

## 2015-12-30 DIAGNOSIS — Z9889 Other specified postprocedural states: Secondary | ICD-10-CM | POA: Diagnosis not present

## 2015-12-30 DIAGNOSIS — L03115 Cellulitis of right lower limb: Secondary | ICD-10-CM | POA: Diagnosis not present

## 2015-12-30 DIAGNOSIS — E11621 Type 2 diabetes mellitus with foot ulcer: Secondary | ICD-10-CM | POA: Diagnosis not present

## 2015-12-31 DIAGNOSIS — E11621 Type 2 diabetes mellitus with foot ulcer: Secondary | ICD-10-CM | POA: Diagnosis not present

## 2015-12-31 DIAGNOSIS — L97514 Non-pressure chronic ulcer of other part of right foot with necrosis of bone: Secondary | ICD-10-CM | POA: Diagnosis not present

## 2016-01-01 DIAGNOSIS — L97514 Non-pressure chronic ulcer of other part of right foot with necrosis of bone: Secondary | ICD-10-CM | POA: Diagnosis not present

## 2016-01-01 DIAGNOSIS — E11621 Type 2 diabetes mellitus with foot ulcer: Secondary | ICD-10-CM | POA: Diagnosis not present

## 2016-01-02 ENCOUNTER — Other Ambulatory Visit (INDEPENDENT_AMBULATORY_CARE_PROVIDER_SITE_OTHER): Payer: Self-pay | Admitting: Internal Medicine

## 2016-01-02 DIAGNOSIS — L97518 Non-pressure chronic ulcer of other part of right foot with other specified severity: Secondary | ICD-10-CM | POA: Diagnosis not present

## 2016-01-02 DIAGNOSIS — F329 Major depressive disorder, single episode, unspecified: Secondary | ICD-10-CM | POA: Diagnosis not present

## 2016-01-02 DIAGNOSIS — Z299 Encounter for prophylactic measures, unspecified: Secondary | ICD-10-CM | POA: Diagnosis not present

## 2016-01-02 DIAGNOSIS — K589 Irritable bowel syndrome without diarrhea: Secondary | ICD-10-CM

## 2016-01-02 DIAGNOSIS — Z6826 Body mass index (BMI) 26.0-26.9, adult: Secondary | ICD-10-CM | POA: Diagnosis not present

## 2016-01-02 DIAGNOSIS — I739 Peripheral vascular disease, unspecified: Secondary | ICD-10-CM | POA: Diagnosis not present

## 2016-01-02 DIAGNOSIS — L97514 Non-pressure chronic ulcer of other part of right foot with necrosis of bone: Secondary | ICD-10-CM | POA: Diagnosis not present

## 2016-01-02 DIAGNOSIS — E11621 Type 2 diabetes mellitus with foot ulcer: Secondary | ICD-10-CM | POA: Diagnosis not present

## 2016-01-06 DIAGNOSIS — F419 Anxiety disorder, unspecified: Secondary | ICD-10-CM | POA: Diagnosis not present

## 2016-01-06 DIAGNOSIS — L03115 Cellulitis of right lower limb: Secondary | ICD-10-CM | POA: Diagnosis not present

## 2016-01-06 DIAGNOSIS — M79671 Pain in right foot: Secondary | ICD-10-CM | POA: Diagnosis not present

## 2016-01-06 DIAGNOSIS — L97518 Non-pressure chronic ulcer of other part of right foot with other specified severity: Secondary | ICD-10-CM | POA: Diagnosis not present

## 2016-01-06 DIAGNOSIS — M868X6 Other osteomyelitis, lower leg: Secondary | ICD-10-CM | POA: Diagnosis not present

## 2016-01-06 DIAGNOSIS — E11621 Type 2 diabetes mellitus with foot ulcer: Secondary | ICD-10-CM | POA: Diagnosis not present

## 2016-01-06 DIAGNOSIS — E11628 Type 2 diabetes mellitus with other skin complications: Secondary | ICD-10-CM | POA: Diagnosis not present

## 2016-01-06 DIAGNOSIS — M869 Osteomyelitis, unspecified: Secondary | ICD-10-CM | POA: Diagnosis not present

## 2016-01-06 DIAGNOSIS — K745 Biliary cirrhosis, unspecified: Secondary | ICD-10-CM | POA: Diagnosis not present

## 2016-01-06 DIAGNOSIS — Z79899 Other long term (current) drug therapy: Secondary | ICD-10-CM | POA: Diagnosis not present

## 2016-01-06 DIAGNOSIS — Z794 Long term (current) use of insulin: Secondary | ICD-10-CM | POA: Diagnosis not present

## 2016-01-06 DIAGNOSIS — E1142 Type 2 diabetes mellitus with diabetic polyneuropathy: Secondary | ICD-10-CM | POA: Diagnosis not present

## 2016-01-06 DIAGNOSIS — E1169 Type 2 diabetes mellitus with other specified complication: Secondary | ICD-10-CM | POA: Diagnosis not present

## 2016-01-06 DIAGNOSIS — H5462 Unqualified visual loss, left eye, normal vision right eye: Secondary | ICD-10-CM | POA: Diagnosis not present

## 2016-01-07 DIAGNOSIS — E1142 Type 2 diabetes mellitus with diabetic polyneuropathy: Secondary | ICD-10-CM | POA: Diagnosis not present

## 2016-01-07 DIAGNOSIS — L03115 Cellulitis of right lower limb: Secondary | ICD-10-CM | POA: Diagnosis not present

## 2016-01-07 DIAGNOSIS — L02611 Cutaneous abscess of right foot: Secondary | ICD-10-CM | POA: Diagnosis not present

## 2016-01-07 DIAGNOSIS — L97512 Non-pressure chronic ulcer of other part of right foot with fat layer exposed: Secondary | ICD-10-CM | POA: Diagnosis not present

## 2016-01-08 DIAGNOSIS — E1142 Type 2 diabetes mellitus with diabetic polyneuropathy: Secondary | ICD-10-CM | POA: Diagnosis not present

## 2016-01-08 DIAGNOSIS — L03115 Cellulitis of right lower limb: Secondary | ICD-10-CM | POA: Diagnosis not present

## 2016-01-08 DIAGNOSIS — M869 Osteomyelitis, unspecified: Secondary | ICD-10-CM | POA: Diagnosis not present

## 2016-01-08 DIAGNOSIS — L97512 Non-pressure chronic ulcer of other part of right foot with fat layer exposed: Secondary | ICD-10-CM | POA: Diagnosis not present

## 2016-01-09 DIAGNOSIS — M869 Osteomyelitis, unspecified: Secondary | ICD-10-CM | POA: Diagnosis not present

## 2016-01-09 DIAGNOSIS — L03115 Cellulitis of right lower limb: Secondary | ICD-10-CM | POA: Diagnosis not present

## 2016-01-09 DIAGNOSIS — E1142 Type 2 diabetes mellitus with diabetic polyneuropathy: Secondary | ICD-10-CM | POA: Diagnosis not present

## 2016-01-09 DIAGNOSIS — E119 Type 2 diabetes mellitus without complications: Secondary | ICD-10-CM | POA: Diagnosis not present

## 2016-01-09 DIAGNOSIS — M7989 Other specified soft tissue disorders: Secondary | ICD-10-CM | POA: Diagnosis not present

## 2016-01-09 DIAGNOSIS — L97512 Non-pressure chronic ulcer of other part of right foot with fat layer exposed: Secondary | ICD-10-CM | POA: Diagnosis not present

## 2016-01-09 DIAGNOSIS — I10 Essential (primary) hypertension: Secondary | ICD-10-CM | POA: Diagnosis not present

## 2016-01-10 DIAGNOSIS — L97512 Non-pressure chronic ulcer of other part of right foot with fat layer exposed: Secondary | ICD-10-CM | POA: Diagnosis not present

## 2016-01-10 DIAGNOSIS — M869 Osteomyelitis, unspecified: Secondary | ICD-10-CM | POA: Diagnosis not present

## 2016-01-10 DIAGNOSIS — Z452 Encounter for adjustment and management of vascular access device: Secondary | ICD-10-CM | POA: Diagnosis not present

## 2016-01-10 DIAGNOSIS — E1142 Type 2 diabetes mellitus with diabetic polyneuropathy: Secondary | ICD-10-CM | POA: Diagnosis not present

## 2016-01-11 DIAGNOSIS — I96 Gangrene, not elsewhere classified: Secondary | ICD-10-CM | POA: Diagnosis not present

## 2016-01-12 DIAGNOSIS — E119 Type 2 diabetes mellitus without complications: Secondary | ICD-10-CM | POA: Diagnosis not present

## 2016-01-12 DIAGNOSIS — L97512 Non-pressure chronic ulcer of other part of right foot with fat layer exposed: Secondary | ICD-10-CM | POA: Diagnosis not present

## 2016-01-12 DIAGNOSIS — M869 Osteomyelitis, unspecified: Secondary | ICD-10-CM | POA: Diagnosis not present

## 2016-01-12 DIAGNOSIS — D649 Anemia, unspecified: Secondary | ICD-10-CM | POA: Diagnosis not present

## 2016-01-12 DIAGNOSIS — I96 Gangrene, not elsewhere classified: Secondary | ICD-10-CM | POA: Diagnosis not present

## 2016-01-12 DIAGNOSIS — E1142 Type 2 diabetes mellitus with diabetic polyneuropathy: Secondary | ICD-10-CM | POA: Diagnosis not present

## 2016-01-14 DIAGNOSIS — Z89431 Acquired absence of right foot: Secondary | ICD-10-CM | POA: Diagnosis not present

## 2016-01-14 DIAGNOSIS — L97518 Non-pressure chronic ulcer of other part of right foot with other specified severity: Secondary | ICD-10-CM | POA: Diagnosis not present

## 2016-01-14 DIAGNOSIS — R278 Other lack of coordination: Secondary | ICD-10-CM | POA: Diagnosis not present

## 2016-01-14 DIAGNOSIS — M868X6 Other osteomyelitis, lower leg: Secondary | ICD-10-CM | POA: Diagnosis not present

## 2016-01-14 DIAGNOSIS — M869 Osteomyelitis, unspecified: Secondary | ICD-10-CM | POA: Diagnosis not present

## 2016-01-14 DIAGNOSIS — M6281 Muscle weakness (generalized): Secondary | ICD-10-CM | POA: Diagnosis not present

## 2016-01-14 DIAGNOSIS — I10 Essential (primary) hypertension: Secondary | ICD-10-CM | POA: Diagnosis not present

## 2016-01-14 DIAGNOSIS — K745 Biliary cirrhosis, unspecified: Secondary | ICD-10-CM | POA: Diagnosis not present

## 2016-01-14 DIAGNOSIS — E871 Hypo-osmolality and hyponatremia: Secondary | ICD-10-CM | POA: Diagnosis not present

## 2016-01-14 DIAGNOSIS — R161 Splenomegaly, not elsewhere classified: Secondary | ICD-10-CM | POA: Diagnosis not present

## 2016-01-14 DIAGNOSIS — F329 Major depressive disorder, single episode, unspecified: Secondary | ICD-10-CM | POA: Diagnosis not present

## 2016-01-14 DIAGNOSIS — R609 Edema, unspecified: Secondary | ICD-10-CM | POA: Diagnosis not present

## 2016-01-14 DIAGNOSIS — H5462 Unqualified visual loss, left eye, normal vision right eye: Secondary | ICD-10-CM | POA: Diagnosis not present

## 2016-01-14 DIAGNOSIS — Z4781 Encounter for orthopedic aftercare following surgical amputation: Secondary | ICD-10-CM | POA: Diagnosis not present

## 2016-01-14 DIAGNOSIS — F419 Anxiety disorder, unspecified: Secondary | ICD-10-CM | POA: Diagnosis not present

## 2016-01-14 DIAGNOSIS — E1140 Type 2 diabetes mellitus with diabetic neuropathy, unspecified: Secondary | ICD-10-CM | POA: Diagnosis not present

## 2016-01-14 DIAGNOSIS — L03115 Cellulitis of right lower limb: Secondary | ICD-10-CM | POA: Diagnosis not present

## 2016-01-14 DIAGNOSIS — Z792 Long term (current) use of antibiotics: Secondary | ICD-10-CM | POA: Diagnosis not present

## 2016-01-14 DIAGNOSIS — E1142 Type 2 diabetes mellitus with diabetic polyneuropathy: Secondary | ICD-10-CM | POA: Diagnosis not present

## 2016-01-23 DIAGNOSIS — E11621 Type 2 diabetes mellitus with foot ulcer: Secondary | ICD-10-CM | POA: Diagnosis not present

## 2016-01-23 DIAGNOSIS — L97519 Non-pressure chronic ulcer of other part of right foot with unspecified severity: Secondary | ICD-10-CM | POA: Diagnosis not present

## 2016-01-23 DIAGNOSIS — L97514 Non-pressure chronic ulcer of other part of right foot with necrosis of bone: Secondary | ICD-10-CM | POA: Diagnosis not present

## 2016-01-23 DIAGNOSIS — Z9889 Other specified postprocedural states: Secondary | ICD-10-CM | POA: Diagnosis not present

## 2016-01-28 DIAGNOSIS — E11621 Type 2 diabetes mellitus with foot ulcer: Secondary | ICD-10-CM | POA: Diagnosis not present

## 2016-01-28 DIAGNOSIS — L97514 Non-pressure chronic ulcer of other part of right foot with necrosis of bone: Secondary | ICD-10-CM | POA: Diagnosis not present

## 2016-01-29 DIAGNOSIS — L97519 Non-pressure chronic ulcer of other part of right foot with unspecified severity: Secondary | ICD-10-CM | POA: Diagnosis not present

## 2016-01-29 DIAGNOSIS — Z89431 Acquired absence of right foot: Secondary | ICD-10-CM | POA: Diagnosis not present

## 2016-01-29 DIAGNOSIS — R161 Splenomegaly, not elsewhere classified: Secondary | ICD-10-CM | POA: Diagnosis not present

## 2016-01-29 DIAGNOSIS — L97514 Non-pressure chronic ulcer of other part of right foot with necrosis of bone: Secondary | ICD-10-CM | POA: Diagnosis not present

## 2016-01-29 DIAGNOSIS — R278 Other lack of coordination: Secondary | ICD-10-CM | POA: Diagnosis not present

## 2016-01-29 DIAGNOSIS — M6281 Muscle weakness (generalized): Secondary | ICD-10-CM | POA: Diagnosis not present

## 2016-01-29 DIAGNOSIS — J984 Other disorders of lung: Secondary | ICD-10-CM | POA: Diagnosis not present

## 2016-01-29 DIAGNOSIS — R609 Edema, unspecified: Secondary | ICD-10-CM | POA: Diagnosis not present

## 2016-01-29 DIAGNOSIS — M869 Osteomyelitis, unspecified: Secondary | ICD-10-CM | POA: Diagnosis not present

## 2016-01-29 DIAGNOSIS — I10 Essential (primary) hypertension: Secondary | ICD-10-CM | POA: Diagnosis not present

## 2016-01-29 DIAGNOSIS — E11621 Type 2 diabetes mellitus with foot ulcer: Secondary | ICD-10-CM | POA: Diagnosis not present

## 2016-01-29 DIAGNOSIS — Z89411 Acquired absence of right great toe: Secondary | ICD-10-CM | POA: Diagnosis not present

## 2016-01-29 DIAGNOSIS — E1140 Type 2 diabetes mellitus with diabetic neuropathy, unspecified: Secondary | ICD-10-CM | POA: Diagnosis not present

## 2016-01-29 DIAGNOSIS — H5462 Unqualified visual loss, left eye, normal vision right eye: Secondary | ICD-10-CM | POA: Diagnosis not present

## 2016-01-29 DIAGNOSIS — Z4781 Encounter for orthopedic aftercare following surgical amputation: Secondary | ICD-10-CM | POA: Diagnosis not present

## 2016-01-29 DIAGNOSIS — K745 Biliary cirrhosis, unspecified: Secondary | ICD-10-CM | POA: Diagnosis not present

## 2016-01-29 DIAGNOSIS — Z792 Long term (current) use of antibiotics: Secondary | ICD-10-CM | POA: Diagnosis not present

## 2016-01-29 DIAGNOSIS — E871 Hypo-osmolality and hyponatremia: Secondary | ICD-10-CM | POA: Diagnosis not present

## 2016-01-29 DIAGNOSIS — F419 Anxiety disorder, unspecified: Secondary | ICD-10-CM | POA: Diagnosis not present

## 2016-01-29 DIAGNOSIS — F329 Major depressive disorder, single episode, unspecified: Secondary | ICD-10-CM | POA: Diagnosis not present

## 2016-01-30 DIAGNOSIS — E11621 Type 2 diabetes mellitus with foot ulcer: Secondary | ICD-10-CM | POA: Diagnosis not present

## 2016-01-30 DIAGNOSIS — L97514 Non-pressure chronic ulcer of other part of right foot with necrosis of bone: Secondary | ICD-10-CM | POA: Diagnosis not present

## 2016-02-03 ENCOUNTER — Telehealth (INDEPENDENT_AMBULATORY_CARE_PROVIDER_SITE_OTHER): Payer: Self-pay | Admitting: Internal Medicine

## 2016-02-03 DIAGNOSIS — R0602 Shortness of breath: Secondary | ICD-10-CM | POA: Diagnosis not present

## 2016-02-03 NOTE — Telephone Encounter (Signed)
Dr.rehman was made aware.

## 2016-02-03 NOTE — Telephone Encounter (Signed)
Patient's daughter Aleene Davidson called to cancel her mom's appointment.  She stated that she is in skilled nursing at [facility].  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  She will be there for at least another 4 weeks.  She stated that she'll call back to reschedule when she is able to come in, but wanted Dr. Laural Golden to know so if he'd like to get any of her labs from [place] he can.  [phone]

## 2016-02-03 NOTE — Telephone Encounter (Signed)
I will make Dr.Rehman aware. 

## 2016-02-04 ENCOUNTER — Ambulatory Visit (INDEPENDENT_AMBULATORY_CARE_PROVIDER_SITE_OTHER): Payer: PPO | Admitting: Internal Medicine

## 2016-02-04 DIAGNOSIS — E11621 Type 2 diabetes mellitus with foot ulcer: Secondary | ICD-10-CM | POA: Diagnosis not present

## 2016-02-04 DIAGNOSIS — L97514 Non-pressure chronic ulcer of other part of right foot with necrosis of bone: Secondary | ICD-10-CM | POA: Diagnosis not present

## 2016-02-06 DIAGNOSIS — M8600 Acute hematogenous osteomyelitis, unspecified site: Secondary | ICD-10-CM | POA: Diagnosis not present

## 2016-02-06 DIAGNOSIS — E11621 Type 2 diabetes mellitus with foot ulcer: Secondary | ICD-10-CM | POA: Diagnosis not present

## 2016-02-06 DIAGNOSIS — L97514 Non-pressure chronic ulcer of other part of right foot with necrosis of bone: Secondary | ICD-10-CM | POA: Diagnosis not present

## 2016-02-06 DIAGNOSIS — K745 Biliary cirrhosis, unspecified: Secondary | ICD-10-CM | POA: Diagnosis not present

## 2016-02-06 DIAGNOSIS — I10 Essential (primary) hypertension: Secondary | ICD-10-CM | POA: Diagnosis not present

## 2016-02-06 DIAGNOSIS — E119 Type 2 diabetes mellitus without complications: Secondary | ICD-10-CM | POA: Diagnosis not present

## 2016-02-11 DIAGNOSIS — L97514 Non-pressure chronic ulcer of other part of right foot with necrosis of bone: Secondary | ICD-10-CM | POA: Diagnosis not present

## 2016-02-11 DIAGNOSIS — E11621 Type 2 diabetes mellitus with foot ulcer: Secondary | ICD-10-CM | POA: Diagnosis not present

## 2016-02-12 DIAGNOSIS — R0602 Shortness of breath: Secondary | ICD-10-CM | POA: Diagnosis not present

## 2016-02-12 DIAGNOSIS — J80 Acute respiratory distress syndrome: Secondary | ICD-10-CM | POA: Diagnosis not present

## 2016-02-19 DIAGNOSIS — Z833 Family history of diabetes mellitus: Secondary | ICD-10-CM | POA: Diagnosis not present

## 2016-02-19 DIAGNOSIS — E119 Type 2 diabetes mellitus without complications: Secondary | ICD-10-CM | POA: Diagnosis not present

## 2016-02-19 DIAGNOSIS — Z853 Personal history of malignant neoplasm of breast: Secondary | ICD-10-CM | POA: Diagnosis not present

## 2016-02-19 DIAGNOSIS — I10 Essential (primary) hypertension: Secondary | ICD-10-CM | POA: Diagnosis not present

## 2016-02-19 DIAGNOSIS — K7460 Unspecified cirrhosis of liver: Secondary | ICD-10-CM | POA: Diagnosis not present

## 2016-02-19 DIAGNOSIS — Z79899 Other long term (current) drug therapy: Secondary | ICD-10-CM | POA: Diagnosis not present

## 2016-02-19 DIAGNOSIS — R140 Abdominal distension (gaseous): Secondary | ICD-10-CM | POA: Diagnosis not present

## 2016-02-19 DIAGNOSIS — J441 Chronic obstructive pulmonary disease with (acute) exacerbation: Secondary | ICD-10-CM | POA: Diagnosis not present

## 2016-02-19 DIAGNOSIS — R109 Unspecified abdominal pain: Secondary | ICD-10-CM | POA: Diagnosis not present

## 2016-02-19 DIAGNOSIS — R0602 Shortness of breath: Secondary | ICD-10-CM | POA: Diagnosis not present

## 2016-02-19 DIAGNOSIS — Z8249 Family history of ischemic heart disease and other diseases of the circulatory system: Secondary | ICD-10-CM | POA: Diagnosis not present

## 2016-02-19 DIAGNOSIS — E876 Hypokalemia: Secondary | ICD-10-CM | POA: Diagnosis not present

## 2016-02-26 DIAGNOSIS — J984 Other disorders of lung: Secondary | ICD-10-CM | POA: Diagnosis not present

## 2016-02-26 DIAGNOSIS — Z89411 Acquired absence of right great toe: Secondary | ICD-10-CM | POA: Diagnosis not present

## 2016-02-26 DIAGNOSIS — L97519 Non-pressure chronic ulcer of other part of right foot with unspecified severity: Secondary | ICD-10-CM | POA: Diagnosis not present

## 2016-02-26 DIAGNOSIS — E11621 Type 2 diabetes mellitus with foot ulcer: Secondary | ICD-10-CM | POA: Diagnosis not present

## 2016-02-26 DIAGNOSIS — L97514 Non-pressure chronic ulcer of other part of right foot with necrosis of bone: Secondary | ICD-10-CM | POA: Diagnosis not present

## 2016-02-28 DIAGNOSIS — H5462 Unqualified visual loss, left eye, normal vision right eye: Secondary | ICD-10-CM | POA: Diagnosis not present

## 2016-02-28 DIAGNOSIS — R161 Splenomegaly, not elsewhere classified: Secondary | ICD-10-CM | POA: Diagnosis not present

## 2016-02-28 DIAGNOSIS — M869 Osteomyelitis, unspecified: Secondary | ICD-10-CM | POA: Diagnosis not present

## 2016-02-28 DIAGNOSIS — E1140 Type 2 diabetes mellitus with diabetic neuropathy, unspecified: Secondary | ICD-10-CM | POA: Diagnosis not present

## 2016-02-28 DIAGNOSIS — Z4781 Encounter for orthopedic aftercare following surgical amputation: Secondary | ICD-10-CM | POA: Diagnosis not present

## 2016-02-28 DIAGNOSIS — F419 Anxiety disorder, unspecified: Secondary | ICD-10-CM | POA: Diagnosis not present

## 2016-02-28 DIAGNOSIS — R609 Edema, unspecified: Secondary | ICD-10-CM | POA: Diagnosis not present

## 2016-02-28 DIAGNOSIS — M6281 Muscle weakness (generalized): Secondary | ICD-10-CM | POA: Diagnosis not present

## 2016-02-28 DIAGNOSIS — E871 Hypo-osmolality and hyponatremia: Secondary | ICD-10-CM | POA: Diagnosis not present

## 2016-02-28 DIAGNOSIS — F329 Major depressive disorder, single episode, unspecified: Secondary | ICD-10-CM | POA: Diagnosis not present

## 2016-02-28 DIAGNOSIS — Z792 Long term (current) use of antibiotics: Secondary | ICD-10-CM | POA: Diagnosis not present

## 2016-02-28 DIAGNOSIS — I10 Essential (primary) hypertension: Secondary | ICD-10-CM | POA: Diagnosis not present

## 2016-02-28 DIAGNOSIS — K745 Biliary cirrhosis, unspecified: Secondary | ICD-10-CM | POA: Diagnosis not present

## 2016-02-28 DIAGNOSIS — R278 Other lack of coordination: Secondary | ICD-10-CM | POA: Diagnosis not present

## 2016-02-28 DIAGNOSIS — Z89431 Acquired absence of right foot: Secondary | ICD-10-CM | POA: Diagnosis not present

## 2016-03-06 DIAGNOSIS — I10 Essential (primary) hypertension: Secondary | ICD-10-CM | POA: Diagnosis not present

## 2016-03-06 DIAGNOSIS — E119 Type 2 diabetes mellitus without complications: Secondary | ICD-10-CM | POA: Diagnosis not present

## 2016-03-06 DIAGNOSIS — J449 Chronic obstructive pulmonary disease, unspecified: Secondary | ICD-10-CM | POA: Diagnosis not present

## 2016-03-06 DIAGNOSIS — M869 Osteomyelitis, unspecified: Secondary | ICD-10-CM | POA: Diagnosis not present

## 2016-03-09 DIAGNOSIS — I480 Paroxysmal atrial fibrillation: Secondary | ICD-10-CM | POA: Diagnosis not present

## 2016-03-09 DIAGNOSIS — I10 Essential (primary) hypertension: Secondary | ICD-10-CM | POA: Diagnosis not present

## 2016-03-09 DIAGNOSIS — F329 Major depressive disorder, single episode, unspecified: Secondary | ICD-10-CM | POA: Diagnosis not present

## 2016-03-09 DIAGNOSIS — Z89421 Acquired absence of other right toe(s): Secondary | ICD-10-CM | POA: Diagnosis not present

## 2016-03-09 DIAGNOSIS — F419 Anxiety disorder, unspecified: Secondary | ICD-10-CM | POA: Diagnosis not present

## 2016-03-09 DIAGNOSIS — H5462 Unqualified visual loss, left eye, normal vision right eye: Secondary | ICD-10-CM | POA: Diagnosis not present

## 2016-03-09 DIAGNOSIS — J449 Chronic obstructive pulmonary disease, unspecified: Secondary | ICD-10-CM | POA: Diagnosis not present

## 2016-03-09 DIAGNOSIS — C8293 Follicular lymphoma, unspecified, intra-abdominal lymph nodes: Secondary | ICD-10-CM | POA: Diagnosis not present

## 2016-03-09 DIAGNOSIS — Z4781 Encounter for orthopedic aftercare following surgical amputation: Secondary | ICD-10-CM | POA: Diagnosis not present

## 2016-03-09 DIAGNOSIS — M5136 Other intervertebral disc degeneration, lumbar region: Secondary | ICD-10-CM | POA: Diagnosis not present

## 2016-03-09 DIAGNOSIS — E1140 Type 2 diabetes mellitus with diabetic neuropathy, unspecified: Secondary | ICD-10-CM | POA: Diagnosis not present

## 2016-03-12 DIAGNOSIS — Z4781 Encounter for orthopedic aftercare following surgical amputation: Secondary | ICD-10-CM | POA: Diagnosis not present

## 2016-03-12 DIAGNOSIS — M5136 Other intervertebral disc degeneration, lumbar region: Secondary | ICD-10-CM | POA: Diagnosis not present

## 2016-03-12 DIAGNOSIS — J449 Chronic obstructive pulmonary disease, unspecified: Secondary | ICD-10-CM | POA: Diagnosis not present

## 2016-03-12 DIAGNOSIS — I480 Paroxysmal atrial fibrillation: Secondary | ICD-10-CM | POA: Diagnosis not present

## 2016-03-12 DIAGNOSIS — F419 Anxiety disorder, unspecified: Secondary | ICD-10-CM | POA: Diagnosis not present

## 2016-03-12 DIAGNOSIS — E1140 Type 2 diabetes mellitus with diabetic neuropathy, unspecified: Secondary | ICD-10-CM | POA: Diagnosis not present

## 2016-03-12 DIAGNOSIS — C8293 Follicular lymphoma, unspecified, intra-abdominal lymph nodes: Secondary | ICD-10-CM | POA: Diagnosis not present

## 2016-03-12 DIAGNOSIS — I10 Essential (primary) hypertension: Secondary | ICD-10-CM | POA: Diagnosis not present

## 2016-03-12 DIAGNOSIS — H5462 Unqualified visual loss, left eye, normal vision right eye: Secondary | ICD-10-CM | POA: Diagnosis not present

## 2016-03-12 DIAGNOSIS — F329 Major depressive disorder, single episode, unspecified: Secondary | ICD-10-CM | POA: Diagnosis not present

## 2016-03-12 DIAGNOSIS — Z89421 Acquired absence of other right toe(s): Secondary | ICD-10-CM | POA: Diagnosis not present

## 2016-03-17 DIAGNOSIS — Z299 Encounter for prophylactic measures, unspecified: Secondary | ICD-10-CM | POA: Diagnosis not present

## 2016-03-17 DIAGNOSIS — I10 Essential (primary) hypertension: Secondary | ICD-10-CM | POA: Diagnosis not present

## 2016-03-17 DIAGNOSIS — Z89421 Acquired absence of other right toe(s): Secondary | ICD-10-CM | POA: Diagnosis not present

## 2016-03-17 DIAGNOSIS — Z4781 Encounter for orthopedic aftercare following surgical amputation: Secondary | ICD-10-CM | POA: Diagnosis not present

## 2016-03-17 DIAGNOSIS — M5136 Other intervertebral disc degeneration, lumbar region: Secondary | ICD-10-CM | POA: Diagnosis not present

## 2016-03-17 DIAGNOSIS — E1140 Type 2 diabetes mellitus with diabetic neuropathy, unspecified: Secondary | ICD-10-CM | POA: Diagnosis not present

## 2016-03-17 DIAGNOSIS — L97518 Non-pressure chronic ulcer of other part of right foot with other specified severity: Secondary | ICD-10-CM | POA: Diagnosis not present

## 2016-03-17 DIAGNOSIS — F329 Major depressive disorder, single episode, unspecified: Secondary | ICD-10-CM | POA: Diagnosis not present

## 2016-03-17 DIAGNOSIS — I480 Paroxysmal atrial fibrillation: Secondary | ICD-10-CM | POA: Diagnosis not present

## 2016-03-17 DIAGNOSIS — E1142 Type 2 diabetes mellitus with diabetic polyneuropathy: Secondary | ICD-10-CM | POA: Diagnosis not present

## 2016-03-17 DIAGNOSIS — H5462 Unqualified visual loss, left eye, normal vision right eye: Secondary | ICD-10-CM | POA: Diagnosis not present

## 2016-03-17 DIAGNOSIS — C8293 Follicular lymphoma, unspecified, intra-abdominal lymph nodes: Secondary | ICD-10-CM | POA: Diagnosis not present

## 2016-03-17 DIAGNOSIS — E11621 Type 2 diabetes mellitus with foot ulcer: Secondary | ICD-10-CM | POA: Diagnosis not present

## 2016-03-17 DIAGNOSIS — F419 Anxiety disorder, unspecified: Secondary | ICD-10-CM | POA: Diagnosis not present

## 2016-03-17 DIAGNOSIS — Z6826 Body mass index (BMI) 26.0-26.9, adult: Secondary | ICD-10-CM | POA: Diagnosis not present

## 2016-03-17 DIAGNOSIS — J449 Chronic obstructive pulmonary disease, unspecified: Secondary | ICD-10-CM | POA: Diagnosis not present

## 2016-03-18 DIAGNOSIS — F419 Anxiety disorder, unspecified: Secondary | ICD-10-CM | POA: Diagnosis not present

## 2016-03-18 DIAGNOSIS — H5462 Unqualified visual loss, left eye, normal vision right eye: Secondary | ICD-10-CM | POA: Diagnosis not present

## 2016-03-18 DIAGNOSIS — I10 Essential (primary) hypertension: Secondary | ICD-10-CM | POA: Diagnosis not present

## 2016-03-18 DIAGNOSIS — I480 Paroxysmal atrial fibrillation: Secondary | ICD-10-CM | POA: Diagnosis not present

## 2016-03-18 DIAGNOSIS — Z4781 Encounter for orthopedic aftercare following surgical amputation: Secondary | ICD-10-CM | POA: Diagnosis not present

## 2016-03-18 DIAGNOSIS — F329 Major depressive disorder, single episode, unspecified: Secondary | ICD-10-CM | POA: Diagnosis not present

## 2016-03-18 DIAGNOSIS — Z89421 Acquired absence of other right toe(s): Secondary | ICD-10-CM | POA: Diagnosis not present

## 2016-03-18 DIAGNOSIS — M5136 Other intervertebral disc degeneration, lumbar region: Secondary | ICD-10-CM | POA: Diagnosis not present

## 2016-03-18 DIAGNOSIS — C8293 Follicular lymphoma, unspecified, intra-abdominal lymph nodes: Secondary | ICD-10-CM | POA: Diagnosis not present

## 2016-03-18 DIAGNOSIS — J449 Chronic obstructive pulmonary disease, unspecified: Secondary | ICD-10-CM | POA: Diagnosis not present

## 2016-03-18 DIAGNOSIS — E1140 Type 2 diabetes mellitus with diabetic neuropathy, unspecified: Secondary | ICD-10-CM | POA: Diagnosis not present

## 2016-03-20 DIAGNOSIS — J449 Chronic obstructive pulmonary disease, unspecified: Secondary | ICD-10-CM | POA: Diagnosis not present

## 2016-03-20 DIAGNOSIS — I480 Paroxysmal atrial fibrillation: Secondary | ICD-10-CM | POA: Diagnosis not present

## 2016-03-20 DIAGNOSIS — F419 Anxiety disorder, unspecified: Secondary | ICD-10-CM | POA: Diagnosis not present

## 2016-03-20 DIAGNOSIS — C8293 Follicular lymphoma, unspecified, intra-abdominal lymph nodes: Secondary | ICD-10-CM | POA: Diagnosis not present

## 2016-03-20 DIAGNOSIS — I10 Essential (primary) hypertension: Secondary | ICD-10-CM | POA: Diagnosis not present

## 2016-03-20 DIAGNOSIS — M5136 Other intervertebral disc degeneration, lumbar region: Secondary | ICD-10-CM | POA: Diagnosis not present

## 2016-03-20 DIAGNOSIS — E1140 Type 2 diabetes mellitus with diabetic neuropathy, unspecified: Secondary | ICD-10-CM | POA: Diagnosis not present

## 2016-03-20 DIAGNOSIS — F329 Major depressive disorder, single episode, unspecified: Secondary | ICD-10-CM | POA: Diagnosis not present

## 2016-03-20 DIAGNOSIS — Z4781 Encounter for orthopedic aftercare following surgical amputation: Secondary | ICD-10-CM | POA: Diagnosis not present

## 2016-03-20 DIAGNOSIS — Z89421 Acquired absence of other right toe(s): Secondary | ICD-10-CM | POA: Diagnosis not present

## 2016-03-20 DIAGNOSIS — H5462 Unqualified visual loss, left eye, normal vision right eye: Secondary | ICD-10-CM | POA: Diagnosis not present

## 2016-03-25 DIAGNOSIS — I10 Essential (primary) hypertension: Secondary | ICD-10-CM | POA: Diagnosis not present

## 2016-03-25 DIAGNOSIS — I480 Paroxysmal atrial fibrillation: Secondary | ICD-10-CM | POA: Diagnosis not present

## 2016-03-25 DIAGNOSIS — C8293 Follicular lymphoma, unspecified, intra-abdominal lymph nodes: Secondary | ICD-10-CM | POA: Diagnosis not present

## 2016-03-25 DIAGNOSIS — F329 Major depressive disorder, single episode, unspecified: Secondary | ICD-10-CM | POA: Diagnosis not present

## 2016-03-25 DIAGNOSIS — H5462 Unqualified visual loss, left eye, normal vision right eye: Secondary | ICD-10-CM | POA: Diagnosis not present

## 2016-03-25 DIAGNOSIS — Z4781 Encounter for orthopedic aftercare following surgical amputation: Secondary | ICD-10-CM | POA: Diagnosis not present

## 2016-03-25 DIAGNOSIS — E1140 Type 2 diabetes mellitus with diabetic neuropathy, unspecified: Secondary | ICD-10-CM | POA: Diagnosis not present

## 2016-03-25 DIAGNOSIS — M5136 Other intervertebral disc degeneration, lumbar region: Secondary | ICD-10-CM | POA: Diagnosis not present

## 2016-03-25 DIAGNOSIS — Z89421 Acquired absence of other right toe(s): Secondary | ICD-10-CM | POA: Diagnosis not present

## 2016-03-25 DIAGNOSIS — F419 Anxiety disorder, unspecified: Secondary | ICD-10-CM | POA: Diagnosis not present

## 2016-03-25 DIAGNOSIS — J449 Chronic obstructive pulmonary disease, unspecified: Secondary | ICD-10-CM | POA: Diagnosis not present

## 2016-03-27 DIAGNOSIS — I10 Essential (primary) hypertension: Secondary | ICD-10-CM | POA: Diagnosis not present

## 2016-03-27 DIAGNOSIS — H5462 Unqualified visual loss, left eye, normal vision right eye: Secondary | ICD-10-CM | POA: Diagnosis not present

## 2016-03-27 DIAGNOSIS — F419 Anxiety disorder, unspecified: Secondary | ICD-10-CM | POA: Diagnosis not present

## 2016-03-27 DIAGNOSIS — Z4781 Encounter for orthopedic aftercare following surgical amputation: Secondary | ICD-10-CM | POA: Diagnosis not present

## 2016-03-27 DIAGNOSIS — Z89421 Acquired absence of other right toe(s): Secondary | ICD-10-CM | POA: Diagnosis not present

## 2016-03-27 DIAGNOSIS — I480 Paroxysmal atrial fibrillation: Secondary | ICD-10-CM | POA: Diagnosis not present

## 2016-03-27 DIAGNOSIS — F329 Major depressive disorder, single episode, unspecified: Secondary | ICD-10-CM | POA: Diagnosis not present

## 2016-03-27 DIAGNOSIS — E1140 Type 2 diabetes mellitus with diabetic neuropathy, unspecified: Secondary | ICD-10-CM | POA: Diagnosis not present

## 2016-03-27 DIAGNOSIS — M5136 Other intervertebral disc degeneration, lumbar region: Secondary | ICD-10-CM | POA: Diagnosis not present

## 2016-03-27 DIAGNOSIS — C8293 Follicular lymphoma, unspecified, intra-abdominal lymph nodes: Secondary | ICD-10-CM | POA: Diagnosis not present

## 2016-03-27 DIAGNOSIS — J449 Chronic obstructive pulmonary disease, unspecified: Secondary | ICD-10-CM | POA: Diagnosis not present

## 2016-04-06 DIAGNOSIS — K7460 Unspecified cirrhosis of liver: Secondary | ICD-10-CM | POA: Diagnosis not present

## 2016-04-06 DIAGNOSIS — R410 Disorientation, unspecified: Secondary | ICD-10-CM | POA: Diagnosis not present

## 2016-04-06 DIAGNOSIS — Z6824 Body mass index (BMI) 24.0-24.9, adult: Secondary | ICD-10-CM | POA: Diagnosis not present

## 2016-04-06 DIAGNOSIS — N390 Urinary tract infection, site not specified: Secondary | ICD-10-CM | POA: Diagnosis not present

## 2016-04-06 DIAGNOSIS — Z87891 Personal history of nicotine dependence: Secondary | ICD-10-CM | POA: Diagnosis not present

## 2016-04-06 DIAGNOSIS — J449 Chronic obstructive pulmonary disease, unspecified: Secondary | ICD-10-CM | POA: Diagnosis not present

## 2016-04-06 DIAGNOSIS — Z299 Encounter for prophylactic measures, unspecified: Secondary | ICD-10-CM | POA: Diagnosis not present

## 2016-04-29 DIAGNOSIS — J449 Chronic obstructive pulmonary disease, unspecified: Secondary | ICD-10-CM | POA: Diagnosis not present

## 2016-04-29 DIAGNOSIS — Z87891 Personal history of nicotine dependence: Secondary | ICD-10-CM | POA: Diagnosis not present

## 2016-04-29 DIAGNOSIS — Z713 Dietary counseling and surveillance: Secondary | ICD-10-CM | POA: Diagnosis not present

## 2016-04-29 DIAGNOSIS — E11621 Type 2 diabetes mellitus with foot ulcer: Secondary | ICD-10-CM | POA: Diagnosis not present

## 2016-04-29 DIAGNOSIS — K7460 Unspecified cirrhosis of liver: Secondary | ICD-10-CM | POA: Diagnosis not present

## 2016-04-29 DIAGNOSIS — Z299 Encounter for prophylactic measures, unspecified: Secondary | ICD-10-CM | POA: Diagnosis not present

## 2016-04-29 DIAGNOSIS — Z6824 Body mass index (BMI) 24.0-24.9, adult: Secondary | ICD-10-CM | POA: Diagnosis not present

## 2016-04-29 DIAGNOSIS — D473 Essential (hemorrhagic) thrombocythemia: Secondary | ICD-10-CM | POA: Diagnosis not present

## 2016-04-29 DIAGNOSIS — F329 Major depressive disorder, single episode, unspecified: Secondary | ICD-10-CM | POA: Diagnosis not present

## 2016-04-29 DIAGNOSIS — E1142 Type 2 diabetes mellitus with diabetic polyneuropathy: Secondary | ICD-10-CM | POA: Diagnosis not present

## 2016-04-29 DIAGNOSIS — L97518 Non-pressure chronic ulcer of other part of right foot with other specified severity: Secondary | ICD-10-CM | POA: Diagnosis not present

## 2016-04-29 DIAGNOSIS — I739 Peripheral vascular disease, unspecified: Secondary | ICD-10-CM | POA: Diagnosis not present

## 2016-05-05 DIAGNOSIS — E1142 Type 2 diabetes mellitus with diabetic polyneuropathy: Secondary | ICD-10-CM | POA: Diagnosis not present

## 2016-05-05 DIAGNOSIS — F419 Anxiety disorder, unspecified: Secondary | ICD-10-CM | POA: Diagnosis not present

## 2016-05-05 DIAGNOSIS — L97522 Non-pressure chronic ulcer of other part of left foot with fat layer exposed: Secondary | ICD-10-CM | POA: Diagnosis not present

## 2016-05-15 DIAGNOSIS — E1142 Type 2 diabetes mellitus with diabetic polyneuropathy: Secondary | ICD-10-CM | POA: Diagnosis not present

## 2016-05-15 DIAGNOSIS — L97521 Non-pressure chronic ulcer of other part of left foot limited to breakdown of skin: Secondary | ICD-10-CM | POA: Diagnosis not present

## 2016-05-19 ENCOUNTER — Ambulatory Visit (INDEPENDENT_AMBULATORY_CARE_PROVIDER_SITE_OTHER): Payer: PPO | Admitting: Internal Medicine

## 2016-05-19 ENCOUNTER — Encounter (INDEPENDENT_AMBULATORY_CARE_PROVIDER_SITE_OTHER): Payer: Self-pay | Admitting: Internal Medicine

## 2016-05-19 VITALS — BP 110/66 | HR 70 | Temp 97.9°F | Resp 18 | Ht 64.0 in | Wt 143.0 lb

## 2016-05-19 DIAGNOSIS — K743 Primary biliary cirrhosis: Secondary | ICD-10-CM

## 2016-05-19 DIAGNOSIS — Z862 Personal history of diseases of the blood and blood-forming organs and certain disorders involving the immune mechanism: Secondary | ICD-10-CM | POA: Diagnosis not present

## 2016-05-19 DIAGNOSIS — K219 Gastro-esophageal reflux disease without esophagitis: Secondary | ICD-10-CM

## 2016-05-19 NOTE — Patient Instructions (Addendum)
Abdominal ultrasound to be done and Dr. Trena Platt office. Will request copy of blood work from [facility].

## 2016-05-19 NOTE — Progress Notes (Signed)
Presenting complaint;  Follow-up for PBC GERD and iron deficiency anemia.  Subjective:  Patient is 71 year old Caucasian female who is here for scheduled visit accompanied by her son Suezanne Jacquet. She had prolonged hospitalization and then she was in rehabilitation and therefore could not come for her 6 month visit. She is back home. She feels she is doing well. She has daily help. In addition she stays intact with her children and her daughter and son who visit virtually every day. She has lost 10 pounds in the last 9 months. She feels weight loss occurred when she had prolonged hospitalization from October 2017 through 03/18/2016. She had amputation of right great toe and she had nonhealing ulcer at left foot. She has good appetite. She denies nausea vomiting or heartburn. She has postprandial burping. She has intermittent diarrhea no more than once or twice a week. She denies melena or rectal bleeding. She has not been confused according to her son.  Current Medications: Outpatient Encounter Prescriptions as of 05/19/2016  Medication Sig  . ABILIFY 2 MG tablet Take 10 mg by mouth daily.   [name] amLODipine (NORVASC) 5 MG tablet Take 5 mg by mouth every morning.  [name] b complex vitamins capsule Take 1 capsule by mouth daily.   . bifidobacterium infantis (ALIGN) capsule Take 1 capsule by mouth daily.  . busPIRone (BUSPAR) 10 MG tablet Take 10 mg by mouth 3 (three) times daily.  . calcium-vitamin D (OSCAL WITH D) 500-200 MG-UNIT per tablet Take 1 tablet by mouth 2 (two) times daily.  . cholecalciferol (VITAMIN D) 1000 UNITS tablet Take 1,000 Units by mouth daily.  . ferrous fumarate (HEMOCYTE - 106 MG FE) 325 (106 FE) MG TABS Take 1 tablet by mouth daily.  [name] FLUoxetine (PROZAC) 20 MG capsule Take 40 mg by mouth daily.  [name] FLUZONE QUADRIVALENT injection Inject 0.5 mLs into the muscle.   . furosemide (LASIX) 20 MG tablet Take 1 tablet (20 mg total) by mouth daily as needed.  . Iron-Vitamins (GERITOL PO) Take 1  tablet by mouth daily.  . lansoprazole (PREVACID) 30 MG capsule take 1 capsule by mouth once daily  . lisinopril (PRINIVIL,ZESTRIL) 2.5 MG tablet Take 2.5 mg by mouth daily.   [name] loperamide (IMODIUM) 2 MG capsule take 1 capsule by mouth four times a day if needed for diarrhea or LOOSE STOOLS  . loratadine (CLARITIN) 10 MG tablet Take 10 mg by mouth daily.  [name] LORazepam (ATIVAN) 1 MG tablet Take 1 mg by mouth. Patient takes 1/2 tablet at morning ,  1/2 by mouth at evening, 1/2 at bedtime.  [name] LYRICA 100 MG capsule Take 100 mg by mouth daily.   . Melatonin 1 MG CAPS Take 1 mg by mouth at bedtime.  . ondansetron (ZOFRAN) 4 MG tablet Take 1 tablet (4 mg total) by mouth every 8 (eight) hours as needed for nausea or vomiting.  . potassium chloride SA (K-DUR,KLOR-CON) 20 MEQ tablet take 1 tablet by mouth twice a day for 1 week then 1 tablet by mouth once daily THEREAFTER  . pravastatin (PRAVACHOL) 20 MG tablet Take 20 mg by mouth daily.   . silver sulfADIAZINE (SILVADENE) 1 % cream Apply 1 application topically daily as needed. For cracking skin on feet.  . ursodiol (ACTIGALL) 300 MG capsule take 1 capsule by mouth three times a day  . VESICARE 5 MG tablet Take by mouth daily.   . vitamin C (ASCORBIC ACID) 500 MG tablet Take 500 mg by mouth daily.  [name]  Zinc 50 MG CAPS Take 50 mg by mouth daily.  . [DISCONTINUED] levofloxacin (LEVAQUIN) 500 MG tablet Take 1 tablet (500 mg total) by mouth daily. (Patient not taking: Reported on 05/19/2016)   No facility-administered encounter medications on file as of 05/19/2016.      Objective: Blood pressure 110/66, pulse 70, temperature 97.9 F (36.6 C), temperature source Oral, resp. rate 18, height 5\' 4"  (1.626 m), weight 143 lb (64.9 kg). Patient is alert and in no acute distress. She does not have asterixis. She has left prosthetic eye. Conjunctiva is pink. Sclera is nonicteric Oropharyngeal mucosa is normal. No neck masses or thyromegaly noted. Cardiac exam  with regular rhythm normal S1 and S2. No murmur or gallop noted. Lungs are clear to auscultation. Abdomen is full. Shifting dullness is absent. Easily palpable spleen. Hepatomegaly with prominent left hepatic lobe and it is firm and nontender. No LE edema or clubbing noted. She has left surgical boot.  Labs/studies Results:   No blood work in epic since April 2017.  Assessment:  #1. PBC. She has developed stigmata of cirrhosis but she appears to have well preserved hepatic function. She is due for screening ultrasound. #2. GERD. She is doing well with therapy. #3. History of iron deficiency anemia possibly related to prior NSAID use. Hemoglobin normal last year.  Plan:  Will request copy of recent blood work from [facility]. Upper abdominal ultrasound to be arranged and Dr. Trena Platt office. Office visit in 6 months.

## 2016-05-25 DIAGNOSIS — K8020 Calculus of gallbladder without cholecystitis without obstruction: Secondary | ICD-10-CM | POA: Diagnosis not present

## 2016-05-25 DIAGNOSIS — N281 Cyst of kidney, acquired: Secondary | ICD-10-CM | POA: Diagnosis not present

## 2016-05-25 DIAGNOSIS — K7460 Unspecified cirrhosis of liver: Secondary | ICD-10-CM | POA: Diagnosis not present

## 2016-05-25 DIAGNOSIS — D734 Cyst of spleen: Secondary | ICD-10-CM | POA: Diagnosis not present

## 2016-05-28 ENCOUNTER — Encounter (INDEPENDENT_AMBULATORY_CARE_PROVIDER_SITE_OTHER): Payer: Self-pay

## 2016-05-29 DIAGNOSIS — L97521 Non-pressure chronic ulcer of other part of left foot limited to breakdown of skin: Secondary | ICD-10-CM | POA: Diagnosis not present

## 2016-05-29 DIAGNOSIS — E1142 Type 2 diabetes mellitus with diabetic polyneuropathy: Secondary | ICD-10-CM | POA: Diagnosis not present

## 2016-06-24 DIAGNOSIS — Z299 Encounter for prophylactic measures, unspecified: Secondary | ICD-10-CM | POA: Diagnosis not present

## 2016-06-24 DIAGNOSIS — L97518 Non-pressure chronic ulcer of other part of right foot with other specified severity: Secondary | ICD-10-CM | POA: Diagnosis not present

## 2016-06-24 DIAGNOSIS — I739 Peripheral vascular disease, unspecified: Secondary | ICD-10-CM | POA: Diagnosis not present

## 2016-06-24 DIAGNOSIS — E11621 Type 2 diabetes mellitus with foot ulcer: Secondary | ICD-10-CM | POA: Diagnosis not present

## 2016-06-24 DIAGNOSIS — E1142 Type 2 diabetes mellitus with diabetic polyneuropathy: Secondary | ICD-10-CM | POA: Diagnosis not present

## 2016-06-24 DIAGNOSIS — D473 Essential (hemorrhagic) thrombocythemia: Secondary | ICD-10-CM | POA: Diagnosis not present

## 2016-06-24 DIAGNOSIS — F329 Major depressive disorder, single episode, unspecified: Secondary | ICD-10-CM | POA: Diagnosis not present

## 2016-06-24 DIAGNOSIS — K7460 Unspecified cirrhosis of liver: Secondary | ICD-10-CM | POA: Diagnosis not present

## 2016-06-24 DIAGNOSIS — F1920 Other psychoactive substance dependence, uncomplicated: Secondary | ICD-10-CM | POA: Diagnosis not present

## 2016-06-24 DIAGNOSIS — J449 Chronic obstructive pulmonary disease, unspecified: Secondary | ICD-10-CM | POA: Diagnosis not present

## 2016-06-24 DIAGNOSIS — E1165 Type 2 diabetes mellitus with hyperglycemia: Secondary | ICD-10-CM | POA: Diagnosis not present

## 2016-06-24 DIAGNOSIS — Z6824 Body mass index (BMI) 24.0-24.9, adult: Secondary | ICD-10-CM | POA: Diagnosis not present

## 2016-07-03 DIAGNOSIS — L97512 Non-pressure chronic ulcer of other part of right foot with fat layer exposed: Secondary | ICD-10-CM | POA: Diagnosis not present

## 2016-07-03 DIAGNOSIS — E1142 Type 2 diabetes mellitus with diabetic polyneuropathy: Secondary | ICD-10-CM | POA: Diagnosis not present

## 2016-07-10 ENCOUNTER — Other Ambulatory Visit (INDEPENDENT_AMBULATORY_CARE_PROVIDER_SITE_OTHER): Payer: Self-pay | Admitting: Internal Medicine

## 2016-07-10 DIAGNOSIS — E1142 Type 2 diabetes mellitus with diabetic polyneuropathy: Secondary | ICD-10-CM | POA: Diagnosis not present

## 2016-07-10 DIAGNOSIS — L97511 Non-pressure chronic ulcer of other part of right foot limited to breakdown of skin: Secondary | ICD-10-CM | POA: Diagnosis not present

## 2016-07-13 NOTE — Telephone Encounter (Signed)
Patient will need to have lab work prior to further refills.

## 2016-07-24 DIAGNOSIS — E1142 Type 2 diabetes mellitus with diabetic polyneuropathy: Secondary | ICD-10-CM | POA: Diagnosis not present

## 2016-07-24 DIAGNOSIS — L97521 Non-pressure chronic ulcer of other part of left foot limited to breakdown of skin: Secondary | ICD-10-CM | POA: Diagnosis not present

## 2016-08-13 DIAGNOSIS — Z299 Encounter for prophylactic measures, unspecified: Secondary | ICD-10-CM | POA: Diagnosis not present

## 2016-08-13 DIAGNOSIS — J449 Chronic obstructive pulmonary disease, unspecified: Secondary | ICD-10-CM | POA: Diagnosis not present

## 2016-08-13 DIAGNOSIS — Z713 Dietary counseling and surveillance: Secondary | ICD-10-CM | POA: Diagnosis not present

## 2016-08-13 DIAGNOSIS — Z6825 Body mass index (BMI) 25.0-25.9, adult: Secondary | ICD-10-CM | POA: Diagnosis not present

## 2016-08-13 DIAGNOSIS — K7460 Unspecified cirrhosis of liver: Secondary | ICD-10-CM | POA: Diagnosis not present

## 2016-08-14 DIAGNOSIS — E1142 Type 2 diabetes mellitus with diabetic polyneuropathy: Secondary | ICD-10-CM | POA: Diagnosis not present

## 2016-08-14 DIAGNOSIS — L97521 Non-pressure chronic ulcer of other part of left foot limited to breakdown of skin: Secondary | ICD-10-CM | POA: Diagnosis not present

## 2016-08-20 ENCOUNTER — Encounter (INDEPENDENT_AMBULATORY_CARE_PROVIDER_SITE_OTHER): Payer: Self-pay

## 2016-09-18 DIAGNOSIS — L851 Acquired keratosis [keratoderma] palmaris et plantaris: Secondary | ICD-10-CM | POA: Diagnosis not present

## 2016-09-18 DIAGNOSIS — E1142 Type 2 diabetes mellitus with diabetic polyneuropathy: Secondary | ICD-10-CM | POA: Diagnosis not present

## 2016-09-18 DIAGNOSIS — B351 Tinea unguium: Secondary | ICD-10-CM | POA: Diagnosis not present

## 2016-09-22 DIAGNOSIS — F062 Psychotic disorder with delusions due to known physiological condition: Secondary | ICD-10-CM | POA: Diagnosis not present

## 2016-09-25 DIAGNOSIS — J449 Chronic obstructive pulmonary disease, unspecified: Secondary | ICD-10-CM | POA: Diagnosis not present

## 2016-09-25 DIAGNOSIS — F329 Major depressive disorder, single episode, unspecified: Secondary | ICD-10-CM | POA: Diagnosis not present

## 2016-09-25 DIAGNOSIS — R350 Frequency of micturition: Secondary | ICD-10-CM | POA: Diagnosis not present

## 2016-09-25 DIAGNOSIS — E11621 Type 2 diabetes mellitus with foot ulcer: Secondary | ICD-10-CM | POA: Diagnosis not present

## 2016-09-25 DIAGNOSIS — Z6825 Body mass index (BMI) 25.0-25.9, adult: Secondary | ICD-10-CM | POA: Diagnosis not present

## 2016-09-25 DIAGNOSIS — Z299 Encounter for prophylactic measures, unspecified: Secondary | ICD-10-CM | POA: Diagnosis not present

## 2016-09-25 DIAGNOSIS — K7460 Unspecified cirrhosis of liver: Secondary | ICD-10-CM | POA: Diagnosis not present

## 2016-09-25 DIAGNOSIS — L97518 Non-pressure chronic ulcer of other part of right foot with other specified severity: Secondary | ICD-10-CM | POA: Diagnosis not present

## 2016-09-25 DIAGNOSIS — M549 Dorsalgia, unspecified: Secondary | ICD-10-CM | POA: Diagnosis not present

## 2016-09-25 DIAGNOSIS — E1165 Type 2 diabetes mellitus with hyperglycemia: Secondary | ICD-10-CM | POA: Diagnosis not present

## 2016-09-25 DIAGNOSIS — E7800 Pure hypercholesterolemia, unspecified: Secondary | ICD-10-CM | POA: Diagnosis not present

## 2016-09-25 DIAGNOSIS — R269 Unspecified abnormalities of gait and mobility: Secondary | ICD-10-CM | POA: Diagnosis not present

## 2016-10-06 DIAGNOSIS — K7460 Unspecified cirrhosis of liver: Secondary | ICD-10-CM | POA: Diagnosis not present

## 2016-10-06 DIAGNOSIS — Z6825 Body mass index (BMI) 25.0-25.9, adult: Secondary | ICD-10-CM | POA: Diagnosis not present

## 2016-10-06 DIAGNOSIS — I10 Essential (primary) hypertension: Secondary | ICD-10-CM | POA: Diagnosis not present

## 2016-10-06 DIAGNOSIS — K743 Primary biliary cirrhosis: Secondary | ICD-10-CM | POA: Diagnosis not present

## 2016-10-06 DIAGNOSIS — E1165 Type 2 diabetes mellitus with hyperglycemia: Secondary | ICD-10-CM | POA: Diagnosis not present

## 2016-10-06 DIAGNOSIS — E7800 Pure hypercholesterolemia, unspecified: Secondary | ICD-10-CM | POA: Diagnosis not present

## 2016-10-06 DIAGNOSIS — Z89411 Acquired absence of right great toe: Secondary | ICD-10-CM | POA: Diagnosis not present

## 2016-10-06 DIAGNOSIS — F1920 Other psychoactive substance dependence, uncomplicated: Secondary | ICD-10-CM | POA: Diagnosis not present

## 2016-10-06 DIAGNOSIS — F329 Major depressive disorder, single episode, unspecified: Secondary | ICD-10-CM | POA: Diagnosis not present

## 2016-10-06 DIAGNOSIS — J449 Chronic obstructive pulmonary disease, unspecified: Secondary | ICD-10-CM | POA: Diagnosis not present

## 2016-10-06 DIAGNOSIS — Z299 Encounter for prophylactic measures, unspecified: Secondary | ICD-10-CM | POA: Diagnosis not present

## 2016-10-06 DIAGNOSIS — I739 Peripheral vascular disease, unspecified: Secondary | ICD-10-CM | POA: Diagnosis not present

## 2016-10-07 DIAGNOSIS — J449 Chronic obstructive pulmonary disease, unspecified: Secondary | ICD-10-CM | POA: Diagnosis not present

## 2016-10-16 DIAGNOSIS — L97529 Non-pressure chronic ulcer of other part of left foot with unspecified severity: Secondary | ICD-10-CM | POA: Diagnosis not present

## 2016-10-16 DIAGNOSIS — E1142 Type 2 diabetes mellitus with diabetic polyneuropathy: Secondary | ICD-10-CM | POA: Diagnosis not present

## 2016-10-18 ENCOUNTER — Other Ambulatory Visit (INDEPENDENT_AMBULATORY_CARE_PROVIDER_SITE_OTHER): Payer: Self-pay | Admitting: Internal Medicine

## 2016-10-31 ENCOUNTER — Other Ambulatory Visit (INDEPENDENT_AMBULATORY_CARE_PROVIDER_SITE_OTHER): Payer: Self-pay | Admitting: Internal Medicine

## 2016-11-07 DIAGNOSIS — J449 Chronic obstructive pulmonary disease, unspecified: Secondary | ICD-10-CM | POA: Diagnosis not present

## 2016-11-20 DIAGNOSIS — B351 Tinea unguium: Secondary | ICD-10-CM | POA: Diagnosis not present

## 2016-11-20 DIAGNOSIS — E1142 Type 2 diabetes mellitus with diabetic polyneuropathy: Secondary | ICD-10-CM | POA: Diagnosis not present

## 2016-11-20 DIAGNOSIS — L851 Acquired keratosis [keratoderma] palmaris et plantaris: Secondary | ICD-10-CM | POA: Diagnosis not present

## 2016-11-24 ENCOUNTER — Ambulatory Visit (INDEPENDENT_AMBULATORY_CARE_PROVIDER_SITE_OTHER): Payer: PPO | Admitting: Internal Medicine

## 2016-12-01 ENCOUNTER — Ambulatory Visit (INDEPENDENT_AMBULATORY_CARE_PROVIDER_SITE_OTHER): Payer: PPO | Admitting: Internal Medicine

## 2016-12-08 DIAGNOSIS — J449 Chronic obstructive pulmonary disease, unspecified: Secondary | ICD-10-CM | POA: Diagnosis not present

## 2016-12-21 ENCOUNTER — Ambulatory Visit (INDEPENDENT_AMBULATORY_CARE_PROVIDER_SITE_OTHER): Payer: PPO | Admitting: Internal Medicine

## 2016-12-21 ENCOUNTER — Encounter (INDEPENDENT_AMBULATORY_CARE_PROVIDER_SITE_OTHER): Payer: Self-pay | Admitting: Internal Medicine

## 2016-12-21 VITALS — BP 110/70 | HR 77 | Temp 98.3°F | Resp 18 | Ht 64.0 in | Wt 147.7 lb

## 2016-12-21 DIAGNOSIS — K580 Irritable bowel syndrome with diarrhea: Secondary | ICD-10-CM

## 2016-12-21 DIAGNOSIS — K743 Primary biliary cirrhosis: Secondary | ICD-10-CM

## 2016-12-21 DIAGNOSIS — Z862 Personal history of diseases of the blood and blood-forming organs and certain disorders involving the immune mechanism: Secondary | ICD-10-CM | POA: Diagnosis not present

## 2016-12-21 MED ORDER — LOPERAMIDE HCL 2 MG PO CAPS: ORAL_CAPSULE | ORAL | 5 refills | Status: AC

## 2016-12-21 NOTE — Patient Instructions (Signed)
Will request copy of recent blood work and ultrasound report from Dr. Trena Platt office for any tests ordered.

## 2016-12-21 NOTE — Progress Notes (Signed)
Presenting complaint;  Follow-up for PBC and IBS. Patient has history of iron deficiency anemia.  Subjective:  Patient is 71 year old Caucasian female who is here for scheduled visit accompanied by her helper Izora Gala today. She was last seen on 05/19/2016. She states she had ultrasound Dr. Trena Platt office a few months ago. She also had blood work through his office round the same time. She remains with postprandial diarrhea. She needs new prescription for loperamide which she uses only when she leaves the house. She denies melena or rectal bleeding. She complains of intermittent pain in left lower quadrant of her abdomen. She says this pain is not associated with diarrhea. She denies flank pain or dysuria. She has very good appetite. She is using walker to ambulate. She says she does try to walk inside the house multiple times a day. She also does usual household work. She says she has also involving left great toe and it is not healing. She is seen podiatrist. She takes fluid medication once or twice a month. She has not taken ondansetron recently.    Current Medications: Outpatient Encounter Prescriptions as of 12/21/2016  Medication Sig  . ABILIFY 2 MG tablet Take 10 mg by mouth daily.   [name] amLODipine (NORVASC) 5 MG tablet Take 5 mg by mouth every morning.  [name] b complex vitamins capsule Take 1 capsule by mouth daily.   . bifidobacterium infantis (ALIGN) capsule Take 1 capsule by mouth daily.  . busPIRone (BUSPAR) 10 MG tablet Take 10 mg by mouth 3 (three) times daily.  . calcium-vitamin D (OSCAL WITH D) 500-200 MG-UNIT per tablet Take 1 tablet by mouth 2 (two) times daily.  . cholecalciferol (VITAMIN D) 1000 UNITS tablet Take 1,000 Units by mouth daily.  . ferrous fumarate (HEMOCYTE - 106 MG FE) 325 (106 FE) MG TABS Take 1 tablet by mouth daily.  [name] FLUoxetine (PROZAC) 20 MG capsule Take 40 mg by mouth daily.  . furosemide (LASIX) 20 MG tablet Take 1 tablet (20 mg total) by mouth daily as needed.   . Iron-Vitamins (GERITOL PO) Take 1 tablet by mouth daily.  . lansoprazole (PREVACID) 30 MG capsule take 1 capsule by mouth once daily  . lisinopril (PRINIVIL,ZESTRIL) 2.5 MG tablet Take 2.5 mg by mouth daily.   [name] loperamide (IMODIUM) 2 MG capsule take 1 capsule by mouth four times a day if needed for diarrhea or LOOSE STOOLS  . loratadine (CLARITIN) 10 MG tablet Take 10 mg by mouth daily.  [name] LORazepam (ATIVAN) 1 MG tablet Take 1 mg by mouth. Patient takes 1/2 tablet at morning ,  1/2 by mouth at evening, 1/2 at bedtime.  [name] LYRICA 100 MG capsule Take 100 mg by mouth daily.   . Melatonin 1 MG CAPS Take 1 mg by mouth at bedtime.  . ondansetron (ZOFRAN) 4 MG tablet Take 1 tablet (4 mg total) by mouth every 8 (eight) hours as needed for nausea or vomiting.  . potassium chloride SA (K-DUR,KLOR-CON) 20 MEQ tablet take 1 tablet by mouth once daily  . pravastatin (PRAVACHOL) 20 MG tablet Take 20 mg by mouth daily.   . silver sulfADIAZINE (SILVADENE) 1 % cream Apply 1 application topically daily as needed. For cracking skin on feet.  . ursodiol (ACTIGALL) 300 MG capsule take 1 capsule by mouth three times a day  . VESICARE 5 MG tablet Take by mouth daily.   . vitamin C (ASCORBIC ACID) 500 MG tablet Take 500 mg by mouth daily.  . Zinc 50  MG CAPS Take 50 mg by mouth daily.  . [DISCONTINUED] FLUZONE QUADRIVALENT injection Inject 0.5 mLs into the muscle.   . [DISCONTINUED] potassium chloride SA (K-DUR,KLOR-CON) 20 MEQ tablet take 1 tablet by mouth once daily (Patient not taking: Reported on 12/21/2016)   No facility-administered encounter medications on file as of 12/21/2016.      Objective: Blood pressure 110/70, pulse 77, temperature 98.3 F (36.8 C), temperature source Oral, resp. rate 18, height 5\' 4"  (1.626 m), weight 147 lb 11.2 oz (67 kg). Patient is alert and in no acute distress. She does not have asterixis. She is blind in left eye. Conjunctiva is pink. Sclera is nonicteric Oropharyngeal  mucosa is normal. No neck masses or thyromegaly noted. Cardiac exam with regular rhythm normal S1 and S2. No murmur or gallop noted. Lungs are clear to auscultation. Abdomen is full. Spleen tip is easily palpable. Liver is enlarged. Prominent left lobe which is mildly tender and firm.  No LE edema or clubbing noted.  Labs/studies Results: Lab data from 06/24/2016  H&H 11.5 and 34.2 and platelet count 101K.   serum iron 59 Serum ferritin 143.  Assessment:  #1. Primary biliary cirrhosis. She was diagnosed in 2005. She has remained well preserved hepatic function. She has not been screened for varices recently. Last EGD was in January 2012 revealing grade 1 esophageal varices. She has declined to undergo EGD on prior visits.  #2. History of iron deficiency anemia for which she is been worked up previously. Etiology was felt to be NSAID use. Hemoglobin low normal 6 months ago.  #3. Irritable bowel syndrome. She is not a candidate for anti-spasmodic therapy. Will continue use loperamide on as-needed basis as longer is working. Left low quadrant abdominal pain may be due to IBS or wall pain. Will monitor.   Plan:  Will request a copy of recent blood work and ultrasound report for further studies are needed. New prescription given per loperamide 2 mg by mouth twice a day when necessary. She should consider EGD for screening for esophageal varices. Will talk with her daughter Lattie Haw. Office visit in 6 months.

## 2016-12-22 ENCOUNTER — Ambulatory Visit (INDEPENDENT_AMBULATORY_CARE_PROVIDER_SITE_OTHER): Payer: PPO | Admitting: Internal Medicine

## 2016-12-25 DIAGNOSIS — L97529 Non-pressure chronic ulcer of other part of left foot with unspecified severity: Secondary | ICD-10-CM | POA: Diagnosis not present

## 2016-12-25 DIAGNOSIS — E1142 Type 2 diabetes mellitus with diabetic polyneuropathy: Secondary | ICD-10-CM | POA: Diagnosis not present

## 2017-01-07 DIAGNOSIS — I739 Peripheral vascular disease, unspecified: Secondary | ICD-10-CM | POA: Diagnosis not present

## 2017-01-07 DIAGNOSIS — F29 Unspecified psychosis not due to a substance or known physiological condition: Secondary | ICD-10-CM | POA: Diagnosis not present

## 2017-01-07 DIAGNOSIS — Z299 Encounter for prophylactic measures, unspecified: Secondary | ICD-10-CM | POA: Diagnosis not present

## 2017-01-07 DIAGNOSIS — E1165 Type 2 diabetes mellitus with hyperglycemia: Secondary | ICD-10-CM | POA: Diagnosis not present

## 2017-01-07 DIAGNOSIS — Z23 Encounter for immunization: Secondary | ICD-10-CM | POA: Diagnosis not present

## 2017-01-07 DIAGNOSIS — I10 Essential (primary) hypertension: Secondary | ICD-10-CM | POA: Diagnosis not present

## 2017-01-07 DIAGNOSIS — J449 Chronic obstructive pulmonary disease, unspecified: Secondary | ICD-10-CM | POA: Diagnosis not present

## 2017-01-07 DIAGNOSIS — K743 Primary biliary cirrhosis: Secondary | ICD-10-CM | POA: Diagnosis not present

## 2017-01-07 DIAGNOSIS — Z6825 Body mass index (BMI) 25.0-25.9, adult: Secondary | ICD-10-CM | POA: Diagnosis not present

## 2017-01-07 DIAGNOSIS — D473 Essential (hemorrhagic) thrombocythemia: Secondary | ICD-10-CM | POA: Diagnosis not present

## 2017-02-07 DIAGNOSIS — J449 Chronic obstructive pulmonary disease, unspecified: Secondary | ICD-10-CM | POA: Diagnosis not present

## 2017-02-08 ENCOUNTER — Telehealth (INDEPENDENT_AMBULATORY_CARE_PROVIDER_SITE_OTHER): Payer: Self-pay | Admitting: Internal Medicine

## 2017-02-08 NOTE — Telephone Encounter (Signed)
Patient's daughter Lattie Haw called, requesting lab orders for her mom.  They are wanting to have them done tomorrow because she will already be in this area.  [phone] Lattie Haw

## 2017-02-09 DIAGNOSIS — F419 Anxiety disorder, unspecified: Secondary | ICD-10-CM | POA: Diagnosis not present

## 2017-02-10 NOTE — Telephone Encounter (Signed)
Talked with patient's daughter, She states that they are having a hard time getting PCP office to send lab work and Ultrasound results to [place]. She is going to try again. She was advised that once they are rec'd and reviewed and the recommendation is made we will call her.  She wants her Mama to move forward with the EGD , and or what else Dr.Rehman may want to do.

## 2017-02-23 DIAGNOSIS — B351 Tinea unguium: Secondary | ICD-10-CM | POA: Diagnosis not present

## 2017-02-23 DIAGNOSIS — E1142 Type 2 diabetes mellitus with diabetic polyneuropathy: Secondary | ICD-10-CM | POA: Diagnosis not present

## 2017-02-23 DIAGNOSIS — L851 Acquired keratosis [keratoderma] palmaris et plantaris: Secondary | ICD-10-CM | POA: Diagnosis not present

## 2017-03-09 DIAGNOSIS — J449 Chronic obstructive pulmonary disease, unspecified: Secondary | ICD-10-CM | POA: Diagnosis not present

## 2017-03-17 DIAGNOSIS — Z299 Encounter for prophylactic measures, unspecified: Secondary | ICD-10-CM | POA: Diagnosis not present

## 2017-03-17 DIAGNOSIS — R5383 Other fatigue: Secondary | ICD-10-CM | POA: Diagnosis not present

## 2017-03-17 DIAGNOSIS — E7800 Pure hypercholesterolemia, unspecified: Secondary | ICD-10-CM | POA: Diagnosis not present

## 2017-03-17 DIAGNOSIS — Z79899 Other long term (current) drug therapy: Secondary | ICD-10-CM | POA: Diagnosis not present

## 2017-03-17 DIAGNOSIS — Z1331 Encounter for screening for depression: Secondary | ICD-10-CM | POA: Diagnosis not present

## 2017-03-17 DIAGNOSIS — Z1339 Encounter for screening examination for other mental health and behavioral disorders: Secondary | ICD-10-CM | POA: Diagnosis not present

## 2017-03-17 DIAGNOSIS — Z7189 Other specified counseling: Secondary | ICD-10-CM | POA: Diagnosis not present

## 2017-03-17 DIAGNOSIS — Z0000 Encounter for general adult medical examination without abnormal findings: Secondary | ICD-10-CM | POA: Diagnosis not present

## 2017-03-17 DIAGNOSIS — Z6825 Body mass index (BMI) 25.0-25.9, adult: Secondary | ICD-10-CM | POA: Diagnosis not present

## 2017-03-17 DIAGNOSIS — E559 Vitamin D deficiency, unspecified: Secondary | ICD-10-CM | POA: Diagnosis not present

## 2017-03-17 DIAGNOSIS — Z1231 Encounter for screening mammogram for malignant neoplasm of breast: Secondary | ICD-10-CM | POA: Diagnosis not present

## 2017-03-17 DIAGNOSIS — Z1211 Encounter for screening for malignant neoplasm of colon: Secondary | ICD-10-CM | POA: Diagnosis not present

## 2017-03-25 DIAGNOSIS — E559 Vitamin D deficiency, unspecified: Secondary | ICD-10-CM | POA: Diagnosis not present

## 2017-03-25 DIAGNOSIS — R5383 Other fatigue: Secondary | ICD-10-CM | POA: Diagnosis not present

## 2017-03-25 DIAGNOSIS — E7800 Pure hypercholesterolemia, unspecified: Secondary | ICD-10-CM | POA: Diagnosis not present

## 2017-03-25 DIAGNOSIS — Z79899 Other long term (current) drug therapy: Secondary | ICD-10-CM | POA: Diagnosis not present

## 2017-04-30 ENCOUNTER — Other Ambulatory Visit (INDEPENDENT_AMBULATORY_CARE_PROVIDER_SITE_OTHER): Payer: Self-pay | Admitting: Internal Medicine

## 2017-06-22 ENCOUNTER — Ambulatory Visit (INDEPENDENT_AMBULATORY_CARE_PROVIDER_SITE_OTHER): Payer: PPO | Admitting: Internal Medicine

## 2017-08-05 ENCOUNTER — Other Ambulatory Visit (INDEPENDENT_AMBULATORY_CARE_PROVIDER_SITE_OTHER): Payer: Self-pay | Admitting: Internal Medicine

## 2017-10-30 ENCOUNTER — Other Ambulatory Visit (INDEPENDENT_AMBULATORY_CARE_PROVIDER_SITE_OTHER): Payer: Self-pay | Admitting: Internal Medicine

## 2017-11-16 ENCOUNTER — Encounter (INDEPENDENT_AMBULATORY_CARE_PROVIDER_SITE_OTHER): Payer: Self-pay | Admitting: *Deleted

## 2017-11-16 ENCOUNTER — Ambulatory Visit (INDEPENDENT_AMBULATORY_CARE_PROVIDER_SITE_OTHER): Payer: Medicare Other | Admitting: Internal Medicine

## 2017-11-16 ENCOUNTER — Encounter (INDEPENDENT_AMBULATORY_CARE_PROVIDER_SITE_OTHER): Payer: Self-pay | Admitting: Internal Medicine

## 2017-11-16 VITALS — BP 110/70 | HR 68 | Temp 98.5°F | Resp 18 | Ht 64.0 in | Wt 156.1 lb

## 2017-11-16 DIAGNOSIS — Z862 Personal history of diseases of the blood and blood-forming organs and certain disorders involving the immune mechanism: Secondary | ICD-10-CM

## 2017-11-16 DIAGNOSIS — K743 Primary biliary cirrhosis: Secondary | ICD-10-CM

## 2017-11-16 DIAGNOSIS — K219 Gastro-esophageal reflux disease without esophagitis: Secondary | ICD-10-CM

## 2017-11-16 NOTE — Patient Instructions (Addendum)
Upper abdominal ultrasound to be done at [facility] internal medicine. Notify if stool test positive for blood. CBC and AFP to be done in 4 weeks.

## 2017-11-16 NOTE — Progress Notes (Signed)
Presenting complaint;  Follow-up for chronic liver disease GERD and iron deficiency anemia.  Database and subjective:  Patient is 72 year old Caucasian female with over 15 year history of PBC who is here for scheduled visit accompanied by her son Suezanne Jacquet.  She was last seen in September 2018. Patient states she was hospitalized at [facility] last week because of shortness of breath.  She was discharged 6 days ago.  Her hemoglobin was noted to be low.  She states she has had intermittent black stools but she has been on iron.  She denies rectal bleeding.  She states she has very good appetite.  She has gained 9 pounds since her last visit.  She has dyspnea on minimal exertion.  She is now on O2 continuously. She says heartburn is well controlled and she denies dysphagia.  She did experience pain across upper abdomen while waiting for me.  She burped and felt better.  She stated she has had this pain in the past when she ate Cheerios.  Her son states that she is using depends because of incontinence and she has been treated for urinary tract infection. She has not experienced confusion lately.   Current Medications: Outpatient Encounter Medications as of 11/16/2017  Medication Sig  . ABILIFY 2 MG tablet Take 10 mg by mouth daily.   [name] amLODipine (NORVASC) 5 MG tablet Take 5 mg by mouth every morning.  [name] b complex vitamins capsule Take 1 capsule by mouth daily.   . bifidobacterium infantis (ALIGN) capsule Take 1 capsule by mouth daily.  . busPIRone (BUSPAR) 15 MG tablet Take 15 mg by mouth 3 (three) times daily.   . calcium-vitamin D (OSCAL WITH D) 500-200 MG-UNIT per tablet Take 1 tablet by mouth 2 (two) times daily.  . cholecalciferol (VITAMIN D) 1000 UNITS tablet Take 1,000 Units by mouth daily.  . ferrous fumarate (HEMOCYTE - 106 MG FE) 325 (106 FE) MG TABS Take 1 tablet by mouth daily.  [name] FLUoxetine (PROZAC) 20 MG capsule Take 40 mg by mouth daily.  . furosemide (LASIX) 20 MG tablet Take 1  tablet (20 mg total) by mouth daily as needed. (Patient taking differently: Take 40 mg by mouth daily as needed. )  . Iron-Vitamins (GERITOL PO) Take 1 tablet by mouth daily.  . lansoprazole (PREVACID) 30 MG capsule take 1 capsule by mouth once daily  . lisinopril (PRINIVIL,ZESTRIL) 2.5 MG tablet Take 2.5 mg by mouth daily.   [name] loperamide (IMODIUM) 2 MG capsule take 1 capsule by mouth four times a day if needed for diarrhea or LOOSE STOOLS  . loratadine (CLARITIN) 10 MG tablet Take 10 mg by mouth daily.  [name] LORazepam (ATIVAN) 1 MG tablet Take 1 mg by mouth. Patient takes 1/2 tablet at morning ,  1/2 by mouth at evening, 1/2 at bedtime.  [name] LYRICA 100 MG capsule Take 100 mg by mouth daily.   . Melatonin 1 MG CAPS Take 1 mg by mouth at bedtime.  . ondansetron (ZOFRAN) 4 MG tablet Take 1 tablet (4 mg total) by mouth every 8 (eight) hours as needed for nausea or vomiting.  . potassium chloride SA (K-DUR,KLOR-CON) 20 MEQ tablet take 1 tablet by mouth once daily  . pravastatin (PRAVACHOL) 20 MG tablet Take 20 mg by mouth daily.   . silver sulfADIAZINE (SILVADENE) 1 % cream Apply 1 application topically daily as needed. For cracking skin on feet.  . ursodiol (ACTIGALL) 300 MG capsule TAKE 1 CAPSULE BY MOUTH THREE TIMES A  DAY  . VESICARE 5 MG tablet Take by mouth daily.   . vitamin C (ASCORBIC ACID) 500 MG tablet Take 500 mg by mouth daily.  . Zinc 50 MG CAPS Take 50 mg by mouth daily.  . [DISCONTINUED] potassium chloride SA (K-DUR,KLOR-CON) 20 MEQ tablet take 1 tablet by mouth once daily (Patient not taking: Reported on 11/16/2017)   No facility-administered encounter medications on file as of 11/16/2017.      Objective: Blood pressure 110/70, pulse 68, temperature 98.5 F (36.9 C), temperature source Oral, resp. rate 18, height 5\' 4"  (1.626 m), weight 156 lb 1.6 oz (70.8 kg). Patient is alert and in no acute distress. She does not have asterixis. She is blind in her left eye. Conjunctiva is pink.  Sclera is nonicteric Oropharyngeal mucosa is normal. No neck masses or thyromegaly noted. Cardiac exam with regular rhythm normal S1 and S2. No murmur or gallop noted. Lungs are clear to auscultation. Abdomen abdomen is full.  On palpation abdomen is soft.  Liver is enlarged with a prominent left lobe.  It is firm and minimally tender.  Spleen tip is also palpable.  Shifting dullness is absent. She has trace edema to left ankle.  Labs/studies Results: Ultrasound 05/25/2016 reveals irregular contour to liver consistent with cirrhosis splenomegaly and gallstones but no ascites.  She also had incidental finding of splenic cyst.  Lab data from 10/27/2017 WBC 3.3, H&H 10.8 and 33.3, platelet count 107K. Serum ammonia 28 Bilirubin 0.9, AP 95, AST 18, ALT 10, total protein 7.1 and albumin 4.0.  Lab data from 11/07/2017 WBC 3.2, H&H 9.3 and 29.2 and platelet count 80K.   Assessment:  #1.  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  Based on imaging studies she is suspected to have advanced to stage IV disease but she remains with well preserved hepatic function. Last ultrasound was over a year ago.  Last ultrasound was in February last year.  She also needs to be screened for esophageal varices at some point.  We will plan EGD after next visit.  #2.  History of iron deficiency anemia.  Recent hemoglobin low.  She is in the process of doing Hemoccult with Dr. Manuella Ghazi.  If stool is heme positive will plan EGD sooner rather than later.  Last colonoscopy was in November 2011 revealing sigmoid diverticulosis and external hemorrhoids.  #3.  Chronic GERD.  She is doing well with therapy.  #4.  Cholelithiasis.  Cholelithiasis appears to be asymptomatic.  She did experience upper abdominal pain while in the office and was relieved with burping.  Therefore pain does not appear to be due to biliary tract disease.  #4.  Leukopenia and thrombocytopenia  secondary to cirrhosis and splenomegaly.   Plan:  Patient encouraged to do Hemoccults as recommended by Dr. Manuella Ghazi. Will wait for the results. Upper abdominal ultrasound for [place] screening. She will have CBC and alpha-fetoprotein in 4 weeks. Office visit in 6 months.

## 2017-11-25 ENCOUNTER — Encounter: Payer: Self-pay | Admitting: Internal Medicine

## 2018-02-03 ENCOUNTER — Other Ambulatory Visit (INDEPENDENT_AMBULATORY_CARE_PROVIDER_SITE_OTHER): Payer: Self-pay | Admitting: *Deleted

## 2018-02-03 MED ORDER — URSODIOL 300 MG PO CAPS: 300.0000 mg | ORAL_CAPSULE | Freq: Three times a day (TID) | ORAL | 4 refills | Status: AC

## 2018-02-03 NOTE — Telephone Encounter (Signed)
Per patient's daughter , the patient is now in [facility]. They are requesting a RX for the medication URSO be faxed to them. The rx was faxed to [phone]. Daughter was made aware.

## 2018-05-24 ENCOUNTER — Ambulatory Visit (INDEPENDENT_AMBULATORY_CARE_PROVIDER_SITE_OTHER): Payer: Medicare Other | Admitting: Internal Medicine

## 2018-06-16 ENCOUNTER — Encounter (INDEPENDENT_AMBULATORY_CARE_PROVIDER_SITE_OTHER): Payer: Self-pay

## 2018-06-28 ENCOUNTER — Encounter (INDEPENDENT_AMBULATORY_CARE_PROVIDER_SITE_OTHER): Payer: Self-pay | Admitting: Internal Medicine

## 2018-06-28 ENCOUNTER — Other Ambulatory Visit: Payer: Self-pay

## 2018-06-28 ENCOUNTER — Ambulatory Visit (INDEPENDENT_AMBULATORY_CARE_PROVIDER_SITE_OTHER): Payer: Medicare Other | Admitting: Internal Medicine

## 2018-06-28 DIAGNOSIS — K743 Primary biliary cirrhosis: Secondary | ICD-10-CM | POA: Diagnosis not present

## 2018-06-28 DIAGNOSIS — Z862 Personal history of diseases of the blood and blood-forming organs and certain disorders involving the immune mechanism: Secondary | ICD-10-CM | POA: Diagnosis not present

## 2018-06-28 NOTE — Progress Notes (Addendum)
Virtual Visit via Video Note Patient is 73 year old Caucasian female who has cirrhosis secondary to primary biliary cirrhosis as well as iron deficiency anemia and GERD who was to have scheduled visit in the office today.  Patient and her daughter Ann Ward requested telephone visit instead because of ongoing COVID-19 pandemic.  Patient and her daughter asked that I speak with them both.  I connected with Ann Ward on 06/28/18 at  1:30 PM EDT verified that I am speaking with the correct person using two identifiers. Following my telephone interaction with Ann Ward I also talk with her daughter Ann Ward and answered all her questions.   I discussed the limitations of evaluation and management by telemedicine and the availability of in person appointments. The patient expressed understanding and agreed to proceed with telephone visit. She is not able to do video. Ann Ward is at [facility] and I am in the office. Ann Ward. LPN is also present for telephone visit  History of Present Illness:  Patient is 73 year old Caucasian female who has advanced cirrhosis secondary to primary biliary cholangitis who was last seen in the office on 11/16/2017.  Patient is now staying at [facility] center. Patient states she is feeling fine.  She has good appetite.  She denies abdominal pain pruritus nausea vomiting dysphagia and her heartburn is well controlled with PPI.  She ambulates with the help of walker.  Patient wonders when she should have a EGD to assess for esophageal varices. According to her daughter Ann Ward patient did not receive Ann Ward when she was transferred from [facility] to nursing home for about 6 weeks.  Her mother was not doing well and they realized that she had not been getting her or so.  She told me after she was placed on Urso she started to feel better. Her daughter states she gets regular blood work at the facility but she does not know if her hemoglobin  was normal or low.  Current Outpatient Medications:  .  ABILIFY 2 MG tablet, Take 10 mg by mouth daily. , Disp: , Rfl:  .  amLODipine (NORVASC) 5 MG tablet, Take 5 mg by mouth every morning., Disp: , Rfl:  .  busPIRone (BUSPAR) 15 MG tablet, Take 15 mg by mouth 3 (three) times daily. , Disp: , Rfl: 0 .  calcium-vitamin D (OSCAL WITH D) 500-200 MG-UNIT per tablet, Take 1 tablet by mouth 3 (three) times daily. , Disp: , Rfl:  .  FLUoxetine (PROZAC) 20 MG capsule, Take 40 mg by mouth daily., Disp: , Rfl:  .  furosemide (LASIX) 20 MG tablet, Take 1 tablet (20 mg total) by mouth daily as needed. (Patient taking differently: Take 40 mg by mouth daily as needed. ), Disp: 30 tablet, Rfl: 2 .  lisinopril (PRINIVIL,ZESTRIL) 2.5 MG tablet, Take 2.5 mg by mouth daily. , Disp: , Rfl: 0 .  loperamide (IMODIUM) 2 MG capsule, take 1 capsule by mouth four times a day if needed for diarrhea or LOOSE STOOLS, Disp: 60 capsule, Rfl: 5 .  LORazepam (ATIVAN) 0.5 MG tablet, Take 0.5 mg by mouth daily as needed. Patient takes 1/2 tablet at morning ,  1/2 by mouth at evening, 1/2 at bedtime., Disp: , Rfl:  .  LYRICA 100 MG capsule, Take 100 mg by mouth daily. , Disp: , Rfl: 0 .  potassium chloride SA (K-DUR,KLOR-CON) 20 MEQ tablet, take 1 tablet by mouth once daily, Disp: 90 tablet, Rfl: 0 .  pravastatin (PRAVACHOL)  20 MG tablet, Take 20 mg by mouth daily. , Disp: , Rfl: 0 .  silver sulfADIAZINE (SILVADENE) 1 % cream, Apply 1 application topically daily as needed. For cracking skin on feet., Disp: , Rfl:  .  ursodiol (ACTIGALL) 300 MG capsule, Take 1 capsule (300 mg total) by mouth 3 (three) times daily., Disp: 270 capsule, Rfl: 4    Observations/Objective: Patient speech over the phone appeared to be normal and I felt she understood and answer questions appropriately.  Assessment and Plan:  #1.  Advanced cirrhosis secondary to primary biliary cholangitis.  She remains on Urso and appears to be stable.  She has not  been screened for esophageal varices for several years and it would be reasonable to screen and treat varices in case she has grade 3 or 4.  Will have to wait until [place] pandemic is over.  In the meantime patient will continue Urso at current dose.  #2.  History of iron deficiency anemia.  Recent labs not available.  Will review these labs when available and go over them with patient's daughter Ann Ward.  Follow Up Instructions:  Office visit in 6 months. May consider EGD before or after her next visit.   I discussed the assessment and treatment plan with the patient. The patient was provided an opportunity to ask questions and all were answered. The patient agreed with the plan and demonstrated an understanding of the instructions.   The patient was advised to call back or seek an in-person evaluation if the symptoms worsen or if the condition fails to improve as anticipated.  I provided 12 minutes of non-face-to-face time during this encounter. 8 minutes were spent talking to the patient and 4 minutes were spent talking with her daughter Ann Ward.   Ann Laser, MD

## 2019-01-03 ENCOUNTER — Ambulatory Visit (INDEPENDENT_AMBULATORY_CARE_PROVIDER_SITE_OTHER): Payer: Medicare Other | Admitting: Internal Medicine

## 2019-03-31 DIAGNOSIS — R2689 Other abnormalities of gait and mobility: Secondary | ICD-10-CM | POA: Diagnosis not present

## 2019-03-31 DIAGNOSIS — Z9181 History of falling: Secondary | ICD-10-CM | POA: Diagnosis not present

## 2019-03-31 DIAGNOSIS — M6281 Muscle weakness (generalized): Secondary | ICD-10-CM | POA: Diagnosis not present

## 2019-04-03 DIAGNOSIS — Z9181 History of falling: Secondary | ICD-10-CM | POA: Diagnosis not present

## 2019-04-03 DIAGNOSIS — M6281 Muscle weakness (generalized): Secondary | ICD-10-CM | POA: Diagnosis not present

## 2019-04-03 DIAGNOSIS — R2689 Other abnormalities of gait and mobility: Secondary | ICD-10-CM | POA: Diagnosis not present

## 2019-04-04 DIAGNOSIS — R2689 Other abnormalities of gait and mobility: Secondary | ICD-10-CM | POA: Diagnosis not present

## 2019-04-04 DIAGNOSIS — M6281 Muscle weakness (generalized): Secondary | ICD-10-CM | POA: Diagnosis not present

## 2019-04-04 DIAGNOSIS — Z9181 History of falling: Secondary | ICD-10-CM | POA: Diagnosis not present

## 2019-04-05 DIAGNOSIS — M6281 Muscle weakness (generalized): Secondary | ICD-10-CM | POA: Diagnosis not present

## 2019-04-05 DIAGNOSIS — R2689 Other abnormalities of gait and mobility: Secondary | ICD-10-CM | POA: Diagnosis not present

## 2019-04-05 DIAGNOSIS — Z9181 History of falling: Secondary | ICD-10-CM | POA: Diagnosis not present

## 2019-04-06 DIAGNOSIS — M6281 Muscle weakness (generalized): Secondary | ICD-10-CM | POA: Diagnosis not present

## 2019-04-06 DIAGNOSIS — Z9181 History of falling: Secondary | ICD-10-CM | POA: Diagnosis not present

## 2019-04-06 DIAGNOSIS — R2689 Other abnormalities of gait and mobility: Secondary | ICD-10-CM | POA: Diagnosis not present

## 2019-04-07 DIAGNOSIS — M6281 Muscle weakness (generalized): Secondary | ICD-10-CM | POA: Diagnosis not present

## 2019-04-07 DIAGNOSIS — Z9181 History of falling: Secondary | ICD-10-CM | POA: Diagnosis not present

## 2019-04-07 DIAGNOSIS — R2689 Other abnormalities of gait and mobility: Secondary | ICD-10-CM | POA: Diagnosis not present

## 2019-04-10 DIAGNOSIS — Z9181 History of falling: Secondary | ICD-10-CM | POA: Diagnosis not present

## 2019-04-10 DIAGNOSIS — R2689 Other abnormalities of gait and mobility: Secondary | ICD-10-CM | POA: Diagnosis not present

## 2019-04-10 DIAGNOSIS — M6281 Muscle weakness (generalized): Secondary | ICD-10-CM | POA: Diagnosis not present

## 2019-04-18 DIAGNOSIS — R2689 Other abnormalities of gait and mobility: Secondary | ICD-10-CM | POA: Diagnosis not present

## 2019-04-18 DIAGNOSIS — M6281 Muscle weakness (generalized): Secondary | ICD-10-CM | POA: Diagnosis not present

## 2019-04-18 DIAGNOSIS — Z9181 History of falling: Secondary | ICD-10-CM | POA: Diagnosis not present

## 2019-04-19 DIAGNOSIS — M6281 Muscle weakness (generalized): Secondary | ICD-10-CM | POA: Diagnosis not present

## 2019-04-19 DIAGNOSIS — R2689 Other abnormalities of gait and mobility: Secondary | ICD-10-CM | POA: Diagnosis not present

## 2019-04-19 DIAGNOSIS — Z9181 History of falling: Secondary | ICD-10-CM | POA: Diagnosis not present

## 2019-04-20 DIAGNOSIS — Z9181 History of falling: Secondary | ICD-10-CM | POA: Diagnosis not present

## 2019-04-20 DIAGNOSIS — R2689 Other abnormalities of gait and mobility: Secondary | ICD-10-CM | POA: Diagnosis not present

## 2019-04-20 DIAGNOSIS — M6281 Muscle weakness (generalized): Secondary | ICD-10-CM | POA: Diagnosis not present

## 2019-04-21 DIAGNOSIS — Z9181 History of falling: Secondary | ICD-10-CM | POA: Diagnosis not present

## 2019-04-21 DIAGNOSIS — R2689 Other abnormalities of gait and mobility: Secondary | ICD-10-CM | POA: Diagnosis not present

## 2019-04-21 DIAGNOSIS — M6281 Muscle weakness (generalized): Secondary | ICD-10-CM | POA: Diagnosis not present

## 2019-04-24 DIAGNOSIS — Z9181 History of falling: Secondary | ICD-10-CM | POA: Diagnosis not present

## 2019-04-24 DIAGNOSIS — M6281 Muscle weakness (generalized): Secondary | ICD-10-CM | POA: Diagnosis not present

## 2019-04-24 DIAGNOSIS — R2689 Other abnormalities of gait and mobility: Secondary | ICD-10-CM | POA: Diagnosis not present

## 2019-04-25 DIAGNOSIS — R2689 Other abnormalities of gait and mobility: Secondary | ICD-10-CM | POA: Diagnosis not present

## 2019-04-25 DIAGNOSIS — M6281 Muscle weakness (generalized): Secondary | ICD-10-CM | POA: Diagnosis not present

## 2019-04-25 DIAGNOSIS — Z9181 History of falling: Secondary | ICD-10-CM | POA: Diagnosis not present

## 2019-04-26 DIAGNOSIS — Z9181 History of falling: Secondary | ICD-10-CM | POA: Diagnosis not present

## 2019-04-26 DIAGNOSIS — M6281 Muscle weakness (generalized): Secondary | ICD-10-CM | POA: Diagnosis not present

## 2019-04-26 DIAGNOSIS — R2689 Other abnormalities of gait and mobility: Secondary | ICD-10-CM | POA: Diagnosis not present

## 2019-04-27 DIAGNOSIS — M6281 Muscle weakness (generalized): Secondary | ICD-10-CM | POA: Diagnosis not present

## 2019-04-27 DIAGNOSIS — R2689 Other abnormalities of gait and mobility: Secondary | ICD-10-CM | POA: Diagnosis not present

## 2019-04-27 DIAGNOSIS — Z9181 History of falling: Secondary | ICD-10-CM | POA: Diagnosis not present

## 2019-04-28 DIAGNOSIS — R2689 Other abnormalities of gait and mobility: Secondary | ICD-10-CM | POA: Diagnosis not present

## 2019-04-28 DIAGNOSIS — Z9181 History of falling: Secondary | ICD-10-CM | POA: Diagnosis not present

## 2019-04-28 DIAGNOSIS — M6281 Muscle weakness (generalized): Secondary | ICD-10-CM | POA: Diagnosis not present

## 2019-05-01 DIAGNOSIS — M6281 Muscle weakness (generalized): Secondary | ICD-10-CM | POA: Diagnosis not present

## 2019-05-02 DIAGNOSIS — M6281 Muscle weakness (generalized): Secondary | ICD-10-CM | POA: Diagnosis not present

## 2019-05-03 DIAGNOSIS — M6281 Muscle weakness (generalized): Secondary | ICD-10-CM | POA: Diagnosis not present

## 2019-05-04 DIAGNOSIS — M6281 Muscle weakness (generalized): Secondary | ICD-10-CM | POA: Diagnosis not present

## 2019-05-05 DIAGNOSIS — M6281 Muscle weakness (generalized): Secondary | ICD-10-CM | POA: Diagnosis not present

## 2019-05-06 DIAGNOSIS — R05 Cough: Secondary | ICD-10-CM | POA: Diagnosis not present

## 2019-05-08 DIAGNOSIS — M6281 Muscle weakness (generalized): Secondary | ICD-10-CM | POA: Diagnosis not present

## 2019-05-09 DIAGNOSIS — M6281 Muscle weakness (generalized): Secondary | ICD-10-CM | POA: Diagnosis not present

## 2019-05-10 DIAGNOSIS — M6281 Muscle weakness (generalized): Secondary | ICD-10-CM | POA: Diagnosis not present

## 2019-05-11 DIAGNOSIS — M6281 Muscle weakness (generalized): Secondary | ICD-10-CM | POA: Diagnosis not present

## 2019-05-14 DIAGNOSIS — J449 Chronic obstructive pulmonary disease, unspecified: Secondary | ICD-10-CM | POA: Diagnosis not present

## 2019-05-14 DIAGNOSIS — Z8679 Personal history of other diseases of the circulatory system: Secondary | ICD-10-CM | POA: Diagnosis not present

## 2019-05-15 DIAGNOSIS — M6281 Muscle weakness (generalized): Secondary | ICD-10-CM | POA: Diagnosis not present

## 2019-05-16 DIAGNOSIS — M6281 Muscle weakness (generalized): Secondary | ICD-10-CM | POA: Diagnosis not present

## 2019-05-17 DIAGNOSIS — M6281 Muscle weakness (generalized): Secondary | ICD-10-CM | POA: Diagnosis not present

## 2019-05-20 DIAGNOSIS — M6281 Muscle weakness (generalized): Secondary | ICD-10-CM | POA: Diagnosis not present

## 2019-05-23 DIAGNOSIS — M1990 Unspecified osteoarthritis, unspecified site: Secondary | ICD-10-CM | POA: Diagnosis not present

## 2019-05-23 DIAGNOSIS — M6281 Muscle weakness (generalized): Secondary | ICD-10-CM | POA: Diagnosis not present

## 2019-05-23 DIAGNOSIS — G8929 Other chronic pain: Secondary | ICD-10-CM | POA: Diagnosis not present

## 2019-05-23 DIAGNOSIS — J449 Chronic obstructive pulmonary disease, unspecified: Secondary | ICD-10-CM | POA: Diagnosis not present

## 2019-05-24 DIAGNOSIS — M6281 Muscle weakness (generalized): Secondary | ICD-10-CM | POA: Diagnosis not present

## 2019-05-25 DIAGNOSIS — M6281 Muscle weakness (generalized): Secondary | ICD-10-CM | POA: Diagnosis not present

## 2019-05-30 DIAGNOSIS — E113291 Type 2 diabetes mellitus with mild nonproliferative diabetic retinopathy without macular edema, right eye: Secondary | ICD-10-CM | POA: Diagnosis not present

## 2019-05-30 DIAGNOSIS — Z7984 Long term (current) use of oral hypoglycemic drugs: Secondary | ICD-10-CM | POA: Diagnosis not present

## 2019-05-30 DIAGNOSIS — Z794 Long term (current) use of insulin: Secondary | ICD-10-CM | POA: Diagnosis not present

## 2019-05-30 DIAGNOSIS — H2522 Age-related cataract, morgagnian type, left eye: Secondary | ICD-10-CM | POA: Diagnosis not present

## 2019-06-13 DIAGNOSIS — J449 Chronic obstructive pulmonary disease, unspecified: Secondary | ICD-10-CM | POA: Diagnosis not present

## 2019-06-13 DIAGNOSIS — I509 Heart failure, unspecified: Secondary | ICD-10-CM | POA: Diagnosis not present

## 2019-06-13 DIAGNOSIS — E1142 Type 2 diabetes mellitus with diabetic polyneuropathy: Secondary | ICD-10-CM | POA: Diagnosis not present

## 2019-06-30 DIAGNOSIS — E860 Dehydration: Secondary | ICD-10-CM | POA: Diagnosis not present

## 2019-07-04 DIAGNOSIS — L89150 Pressure ulcer of sacral region, unstageable: Secondary | ICD-10-CM | POA: Diagnosis not present

## 2019-07-04 DIAGNOSIS — R197 Diarrhea, unspecified: Secondary | ICD-10-CM | POA: Diagnosis not present

## 2019-07-04 DIAGNOSIS — L03119 Cellulitis of unspecified part of limb: Secondary | ICD-10-CM | POA: Diagnosis not present

## 2019-07-04 DIAGNOSIS — E860 Dehydration: Secondary | ICD-10-CM | POA: Diagnosis not present

## 2019-07-11 DIAGNOSIS — Z794 Long term (current) use of insulin: Secondary | ICD-10-CM | POA: Diagnosis not present

## 2019-07-11 DIAGNOSIS — R262 Difficulty in walking, not elsewhere classified: Secondary | ICD-10-CM | POA: Diagnosis not present

## 2019-07-11 DIAGNOSIS — E1151 Type 2 diabetes mellitus with diabetic peripheral angiopathy without gangrene: Secondary | ICD-10-CM | POA: Diagnosis not present

## 2019-07-11 DIAGNOSIS — E1140 Type 2 diabetes mellitus with diabetic neuropathy, unspecified: Secondary | ICD-10-CM | POA: Diagnosis not present

## 2019-07-14 DIAGNOSIS — J449 Chronic obstructive pulmonary disease, unspecified: Secondary | ICD-10-CM | POA: Diagnosis not present

## 2019-07-14 DIAGNOSIS — E1165 Type 2 diabetes mellitus with hyperglycemia: Secondary | ICD-10-CM | POA: Diagnosis not present

## 2019-07-14 DIAGNOSIS — Z299 Encounter for prophylactic measures, unspecified: Secondary | ICD-10-CM | POA: Diagnosis not present

## 2019-07-14 DIAGNOSIS — E1142 Type 2 diabetes mellitus with diabetic polyneuropathy: Secondary | ICD-10-CM | POA: Diagnosis not present

## 2019-07-14 DIAGNOSIS — J9611 Chronic respiratory failure with hypoxia: Secondary | ICD-10-CM | POA: Diagnosis not present

## 2019-07-14 DIAGNOSIS — Z87891 Personal history of nicotine dependence: Secondary | ICD-10-CM | POA: Diagnosis not present

## 2019-07-18 DIAGNOSIS — I10 Essential (primary) hypertension: Secondary | ICD-10-CM | POA: Diagnosis not present

## 2019-07-18 DIAGNOSIS — Z0000 Encounter for general adult medical examination without abnormal findings: Secondary | ICD-10-CM | POA: Diagnosis not present

## 2019-07-18 DIAGNOSIS — Z299 Encounter for prophylactic measures, unspecified: Secondary | ICD-10-CM | POA: Diagnosis not present

## 2019-07-18 DIAGNOSIS — Z7189 Other specified counseling: Secondary | ICD-10-CM | POA: Diagnosis not present

## 2019-08-09 DIAGNOSIS — M6281 Muscle weakness (generalized): Secondary | ICD-10-CM | POA: Diagnosis not present

## 2019-08-09 DIAGNOSIS — I6900 Unspecified sequelae of nontraumatic subarachnoid hemorrhage: Secondary | ICD-10-CM | POA: Diagnosis not present

## 2019-08-09 DIAGNOSIS — R2689 Other abnormalities of gait and mobility: Secondary | ICD-10-CM | POA: Diagnosis not present

## 2019-08-11 DIAGNOSIS — I6900 Unspecified sequelae of nontraumatic subarachnoid hemorrhage: Secondary | ICD-10-CM | POA: Diagnosis not present

## 2019-08-11 DIAGNOSIS — R2689 Other abnormalities of gait and mobility: Secondary | ICD-10-CM | POA: Diagnosis not present

## 2019-08-11 DIAGNOSIS — M6281 Muscle weakness (generalized): Secondary | ICD-10-CM | POA: Diagnosis not present

## 2019-08-14 DIAGNOSIS — M6281 Muscle weakness (generalized): Secondary | ICD-10-CM | POA: Diagnosis not present

## 2019-08-14 DIAGNOSIS — R2689 Other abnormalities of gait and mobility: Secondary | ICD-10-CM | POA: Diagnosis not present

## 2019-08-14 DIAGNOSIS — I6900 Unspecified sequelae of nontraumatic subarachnoid hemorrhage: Secondary | ICD-10-CM | POA: Diagnosis not present

## 2019-08-15 DIAGNOSIS — I6900 Unspecified sequelae of nontraumatic subarachnoid hemorrhage: Secondary | ICD-10-CM | POA: Diagnosis not present

## 2019-08-15 DIAGNOSIS — I10 Essential (primary) hypertension: Secondary | ICD-10-CM | POA: Diagnosis not present

## 2019-08-15 DIAGNOSIS — M6281 Muscle weakness (generalized): Secondary | ICD-10-CM | POA: Diagnosis not present

## 2019-08-15 DIAGNOSIS — J9611 Chronic respiratory failure with hypoxia: Secondary | ICD-10-CM | POA: Diagnosis not present

## 2019-08-15 DIAGNOSIS — R609 Edema, unspecified: Secondary | ICD-10-CM | POA: Diagnosis not present

## 2019-08-15 DIAGNOSIS — R2689 Other abnormalities of gait and mobility: Secondary | ICD-10-CM | POA: Diagnosis not present

## 2019-08-15 DIAGNOSIS — S98111A Complete traumatic amputation of right great toe, initial encounter: Secondary | ICD-10-CM | POA: Diagnosis not present

## 2019-08-16 DIAGNOSIS — M6281 Muscle weakness (generalized): Secondary | ICD-10-CM | POA: Diagnosis not present

## 2019-08-16 DIAGNOSIS — R2689 Other abnormalities of gait and mobility: Secondary | ICD-10-CM | POA: Diagnosis not present

## 2019-08-16 DIAGNOSIS — I6900 Unspecified sequelae of nontraumatic subarachnoid hemorrhage: Secondary | ICD-10-CM | POA: Diagnosis not present

## 2019-08-17 DIAGNOSIS — M6281 Muscle weakness (generalized): Secondary | ICD-10-CM | POA: Diagnosis not present

## 2019-08-17 DIAGNOSIS — I6900 Unspecified sequelae of nontraumatic subarachnoid hemorrhage: Secondary | ICD-10-CM | POA: Diagnosis not present

## 2019-08-17 DIAGNOSIS — R2689 Other abnormalities of gait and mobility: Secondary | ICD-10-CM | POA: Diagnosis not present

## 2019-08-18 DIAGNOSIS — M6281 Muscle weakness (generalized): Secondary | ICD-10-CM | POA: Diagnosis not present

## 2019-08-18 DIAGNOSIS — R2689 Other abnormalities of gait and mobility: Secondary | ICD-10-CM | POA: Diagnosis not present

## 2019-08-18 DIAGNOSIS — I6900 Unspecified sequelae of nontraumatic subarachnoid hemorrhage: Secondary | ICD-10-CM | POA: Diagnosis not present

## 2019-08-22 DIAGNOSIS — R2689 Other abnormalities of gait and mobility: Secondary | ICD-10-CM | POA: Diagnosis not present

## 2019-08-22 DIAGNOSIS — Z299 Encounter for prophylactic measures, unspecified: Secondary | ICD-10-CM | POA: Diagnosis not present

## 2019-08-22 DIAGNOSIS — M6281 Muscle weakness (generalized): Secondary | ICD-10-CM | POA: Diagnosis not present

## 2019-08-22 DIAGNOSIS — I10 Essential (primary) hypertension: Secondary | ICD-10-CM | POA: Diagnosis not present

## 2019-08-22 DIAGNOSIS — K7460 Unspecified cirrhosis of liver: Secondary | ICD-10-CM | POA: Diagnosis not present

## 2019-08-22 DIAGNOSIS — I6900 Unspecified sequelae of nontraumatic subarachnoid hemorrhage: Secondary | ICD-10-CM | POA: Diagnosis not present

## 2019-08-23 DIAGNOSIS — R2689 Other abnormalities of gait and mobility: Secondary | ICD-10-CM | POA: Diagnosis not present

## 2019-08-23 DIAGNOSIS — I6900 Unspecified sequelae of nontraumatic subarachnoid hemorrhage: Secondary | ICD-10-CM | POA: Diagnosis not present

## 2019-08-23 DIAGNOSIS — M6281 Muscle weakness (generalized): Secondary | ICD-10-CM | POA: Diagnosis not present

## 2019-08-24 DIAGNOSIS — R2689 Other abnormalities of gait and mobility: Secondary | ICD-10-CM | POA: Diagnosis not present

## 2019-08-24 DIAGNOSIS — M6281 Muscle weakness (generalized): Secondary | ICD-10-CM | POA: Diagnosis not present

## 2019-08-24 DIAGNOSIS — I6900 Unspecified sequelae of nontraumatic subarachnoid hemorrhage: Secondary | ICD-10-CM | POA: Diagnosis not present

## 2019-08-28 DIAGNOSIS — R2689 Other abnormalities of gait and mobility: Secondary | ICD-10-CM | POA: Diagnosis not present

## 2019-08-28 DIAGNOSIS — I6900 Unspecified sequelae of nontraumatic subarachnoid hemorrhage: Secondary | ICD-10-CM | POA: Diagnosis not present

## 2019-08-28 DIAGNOSIS — M6281 Muscle weakness (generalized): Secondary | ICD-10-CM | POA: Diagnosis not present

## 2019-08-29 DIAGNOSIS — I6900 Unspecified sequelae of nontraumatic subarachnoid hemorrhage: Secondary | ICD-10-CM | POA: Diagnosis not present

## 2019-08-29 DIAGNOSIS — M6281 Muscle weakness (generalized): Secondary | ICD-10-CM | POA: Diagnosis not present

## 2019-08-29 DIAGNOSIS — R2689 Other abnormalities of gait and mobility: Secondary | ICD-10-CM | POA: Diagnosis not present

## 2019-08-30 DIAGNOSIS — E11319 Type 2 diabetes mellitus with unspecified diabetic retinopathy without macular edema: Secondary | ICD-10-CM | POA: Diagnosis not present

## 2019-08-30 DIAGNOSIS — H35049 Retinal micro-aneurysms, unspecified, unspecified eye: Secondary | ICD-10-CM | POA: Diagnosis not present

## 2019-08-30 DIAGNOSIS — R2689 Other abnormalities of gait and mobility: Secondary | ICD-10-CM | POA: Diagnosis not present

## 2019-08-30 DIAGNOSIS — I6900 Unspecified sequelae of nontraumatic subarachnoid hemorrhage: Secondary | ICD-10-CM | POA: Diagnosis not present

## 2019-08-30 DIAGNOSIS — M6281 Muscle weakness (generalized): Secondary | ICD-10-CM | POA: Diagnosis not present

## 2019-08-31 DIAGNOSIS — M6281 Muscle weakness (generalized): Secondary | ICD-10-CM | POA: Diagnosis not present

## 2019-08-31 DIAGNOSIS — I6900 Unspecified sequelae of nontraumatic subarachnoid hemorrhage: Secondary | ICD-10-CM | POA: Diagnosis not present

## 2019-08-31 DIAGNOSIS — R2689 Other abnormalities of gait and mobility: Secondary | ICD-10-CM | POA: Diagnosis not present

## 2019-09-01 DIAGNOSIS — M6281 Muscle weakness (generalized): Secondary | ICD-10-CM | POA: Diagnosis not present

## 2019-09-01 DIAGNOSIS — R2689 Other abnormalities of gait and mobility: Secondary | ICD-10-CM | POA: Diagnosis not present

## 2019-09-01 DIAGNOSIS — I6900 Unspecified sequelae of nontraumatic subarachnoid hemorrhage: Secondary | ICD-10-CM | POA: Diagnosis not present

## 2019-09-03 DIAGNOSIS — M6281 Muscle weakness (generalized): Secondary | ICD-10-CM | POA: Diagnosis not present

## 2019-09-03 DIAGNOSIS — R2689 Other abnormalities of gait and mobility: Secondary | ICD-10-CM | POA: Diagnosis not present

## 2019-09-03 DIAGNOSIS — I6900 Unspecified sequelae of nontraumatic subarachnoid hemorrhage: Secondary | ICD-10-CM | POA: Diagnosis not present

## 2019-09-04 DIAGNOSIS — M6281 Muscle weakness (generalized): Secondary | ICD-10-CM | POA: Diagnosis not present

## 2019-09-04 DIAGNOSIS — I6900 Unspecified sequelae of nontraumatic subarachnoid hemorrhage: Secondary | ICD-10-CM | POA: Diagnosis not present

## 2019-09-04 DIAGNOSIS — R2689 Other abnormalities of gait and mobility: Secondary | ICD-10-CM | POA: Diagnosis not present

## 2019-09-05 DIAGNOSIS — M6281 Muscle weakness (generalized): Secondary | ICD-10-CM | POA: Diagnosis not present

## 2019-09-05 DIAGNOSIS — R2689 Other abnormalities of gait and mobility: Secondary | ICD-10-CM | POA: Diagnosis not present

## 2019-09-05 DIAGNOSIS — I6900 Unspecified sequelae of nontraumatic subarachnoid hemorrhage: Secondary | ICD-10-CM | POA: Diagnosis not present

## 2019-09-06 DIAGNOSIS — I6900 Unspecified sequelae of nontraumatic subarachnoid hemorrhage: Secondary | ICD-10-CM | POA: Diagnosis not present

## 2019-09-06 DIAGNOSIS — R2689 Other abnormalities of gait and mobility: Secondary | ICD-10-CM | POA: Diagnosis not present

## 2019-09-06 DIAGNOSIS — M6281 Muscle weakness (generalized): Secondary | ICD-10-CM | POA: Diagnosis not present

## 2019-09-07 DIAGNOSIS — I6900 Unspecified sequelae of nontraumatic subarachnoid hemorrhage: Secondary | ICD-10-CM | POA: Diagnosis not present

## 2019-09-07 DIAGNOSIS — M6281 Muscle weakness (generalized): Secondary | ICD-10-CM | POA: Diagnosis not present

## 2019-09-07 DIAGNOSIS — R2689 Other abnormalities of gait and mobility: Secondary | ICD-10-CM | POA: Diagnosis not present

## 2019-09-08 DIAGNOSIS — R2689 Other abnormalities of gait and mobility: Secondary | ICD-10-CM | POA: Diagnosis not present

## 2019-09-08 DIAGNOSIS — I6900 Unspecified sequelae of nontraumatic subarachnoid hemorrhage: Secondary | ICD-10-CM | POA: Diagnosis not present

## 2019-09-08 DIAGNOSIS — M6281 Muscle weakness (generalized): Secondary | ICD-10-CM | POA: Diagnosis not present

## 2019-09-12 DIAGNOSIS — M6281 Muscle weakness (generalized): Secondary | ICD-10-CM | POA: Diagnosis not present

## 2019-09-12 DIAGNOSIS — I6900 Unspecified sequelae of nontraumatic subarachnoid hemorrhage: Secondary | ICD-10-CM | POA: Diagnosis not present

## 2019-09-12 DIAGNOSIS — R2689 Other abnormalities of gait and mobility: Secondary | ICD-10-CM | POA: Diagnosis not present

## 2019-09-13 DIAGNOSIS — M6281 Muscle weakness (generalized): Secondary | ICD-10-CM | POA: Diagnosis not present

## 2019-09-13 DIAGNOSIS — R2689 Other abnormalities of gait and mobility: Secondary | ICD-10-CM | POA: Diagnosis not present

## 2019-09-13 DIAGNOSIS — I6900 Unspecified sequelae of nontraumatic subarachnoid hemorrhage: Secondary | ICD-10-CM | POA: Diagnosis not present

## 2019-09-14 DIAGNOSIS — R2689 Other abnormalities of gait and mobility: Secondary | ICD-10-CM | POA: Diagnosis not present

## 2019-09-14 DIAGNOSIS — M6281 Muscle weakness (generalized): Secondary | ICD-10-CM | POA: Diagnosis not present

## 2019-09-14 DIAGNOSIS — I6900 Unspecified sequelae of nontraumatic subarachnoid hemorrhage: Secondary | ICD-10-CM | POA: Diagnosis not present

## 2019-09-15 DIAGNOSIS — M6281 Muscle weakness (generalized): Secondary | ICD-10-CM | POA: Diagnosis not present

## 2019-09-15 DIAGNOSIS — I6900 Unspecified sequelae of nontraumatic subarachnoid hemorrhage: Secondary | ICD-10-CM | POA: Diagnosis not present

## 2019-09-15 DIAGNOSIS — R2689 Other abnormalities of gait and mobility: Secondary | ICD-10-CM | POA: Diagnosis not present

## 2019-09-16 DIAGNOSIS — I6900 Unspecified sequelae of nontraumatic subarachnoid hemorrhage: Secondary | ICD-10-CM | POA: Diagnosis not present

## 2019-09-16 DIAGNOSIS — R2689 Other abnormalities of gait and mobility: Secondary | ICD-10-CM | POA: Diagnosis not present

## 2019-09-16 DIAGNOSIS — M6281 Muscle weakness (generalized): Secondary | ICD-10-CM | POA: Diagnosis not present

## 2019-09-18 DIAGNOSIS — I6900 Unspecified sequelae of nontraumatic subarachnoid hemorrhage: Secondary | ICD-10-CM | POA: Diagnosis not present

## 2019-09-18 DIAGNOSIS — M6281 Muscle weakness (generalized): Secondary | ICD-10-CM | POA: Diagnosis not present

## 2019-09-18 DIAGNOSIS — R2689 Other abnormalities of gait and mobility: Secondary | ICD-10-CM | POA: Diagnosis not present

## 2019-09-19 DIAGNOSIS — M6281 Muscle weakness (generalized): Secondary | ICD-10-CM | POA: Diagnosis not present

## 2019-09-19 DIAGNOSIS — I6900 Unspecified sequelae of nontraumatic subarachnoid hemorrhage: Secondary | ICD-10-CM | POA: Diagnosis not present

## 2019-09-19 DIAGNOSIS — R2689 Other abnormalities of gait and mobility: Secondary | ICD-10-CM | POA: Diagnosis not present

## 2019-09-20 DIAGNOSIS — E1142 Type 2 diabetes mellitus with diabetic polyneuropathy: Secondary | ICD-10-CM | POA: Diagnosis not present

## 2019-09-20 DIAGNOSIS — I6900 Unspecified sequelae of nontraumatic subarachnoid hemorrhage: Secondary | ICD-10-CM | POA: Diagnosis not present

## 2019-09-20 DIAGNOSIS — M6281 Muscle weakness (generalized): Secondary | ICD-10-CM | POA: Diagnosis not present

## 2019-09-20 DIAGNOSIS — R2689 Other abnormalities of gait and mobility: Secondary | ICD-10-CM | POA: Diagnosis not present

## 2019-09-21 DIAGNOSIS — I6900 Unspecified sequelae of nontraumatic subarachnoid hemorrhage: Secondary | ICD-10-CM | POA: Diagnosis not present

## 2019-09-21 DIAGNOSIS — M6281 Muscle weakness (generalized): Secondary | ICD-10-CM | POA: Diagnosis not present

## 2019-09-21 DIAGNOSIS — R2689 Other abnormalities of gait and mobility: Secondary | ICD-10-CM | POA: Diagnosis not present

## 2019-09-22 DIAGNOSIS — M6281 Muscle weakness (generalized): Secondary | ICD-10-CM | POA: Diagnosis not present

## 2019-09-22 DIAGNOSIS — I6900 Unspecified sequelae of nontraumatic subarachnoid hemorrhage: Secondary | ICD-10-CM | POA: Diagnosis not present

## 2019-09-22 DIAGNOSIS — R2689 Other abnormalities of gait and mobility: Secondary | ICD-10-CM | POA: Diagnosis not present

## 2019-09-25 DIAGNOSIS — R2689 Other abnormalities of gait and mobility: Secondary | ICD-10-CM | POA: Diagnosis not present

## 2019-09-25 DIAGNOSIS — I6900 Unspecified sequelae of nontraumatic subarachnoid hemorrhage: Secondary | ICD-10-CM | POA: Diagnosis not present

## 2019-09-25 DIAGNOSIS — M6281 Muscle weakness (generalized): Secondary | ICD-10-CM | POA: Diagnosis not present

## 2019-09-26 DIAGNOSIS — M6281 Muscle weakness (generalized): Secondary | ICD-10-CM | POA: Diagnosis not present

## 2019-09-26 DIAGNOSIS — I6900 Unspecified sequelae of nontraumatic subarachnoid hemorrhage: Secondary | ICD-10-CM | POA: Diagnosis not present

## 2019-09-26 DIAGNOSIS — R2689 Other abnormalities of gait and mobility: Secondary | ICD-10-CM | POA: Diagnosis not present

## 2019-09-27 DIAGNOSIS — I6900 Unspecified sequelae of nontraumatic subarachnoid hemorrhage: Secondary | ICD-10-CM | POA: Diagnosis not present

## 2019-09-27 DIAGNOSIS — M6281 Muscle weakness (generalized): Secondary | ICD-10-CM | POA: Diagnosis not present

## 2019-09-27 DIAGNOSIS — R2689 Other abnormalities of gait and mobility: Secondary | ICD-10-CM | POA: Diagnosis not present

## 2019-09-28 DIAGNOSIS — I6900 Unspecified sequelae of nontraumatic subarachnoid hemorrhage: Secondary | ICD-10-CM | POA: Diagnosis not present

## 2019-09-28 DIAGNOSIS — M6281 Muscle weakness (generalized): Secondary | ICD-10-CM | POA: Diagnosis not present

## 2019-09-28 DIAGNOSIS — R2689 Other abnormalities of gait and mobility: Secondary | ICD-10-CM | POA: Diagnosis not present

## 2019-09-29 DIAGNOSIS — R2689 Other abnormalities of gait and mobility: Secondary | ICD-10-CM | POA: Diagnosis not present

## 2019-09-29 DIAGNOSIS — I6900 Unspecified sequelae of nontraumatic subarachnoid hemorrhage: Secondary | ICD-10-CM | POA: Diagnosis not present

## 2019-09-29 DIAGNOSIS — M6281 Muscle weakness (generalized): Secondary | ICD-10-CM | POA: Diagnosis not present

## 2019-10-02 DIAGNOSIS — R2689 Other abnormalities of gait and mobility: Secondary | ICD-10-CM | POA: Diagnosis not present

## 2019-10-02 DIAGNOSIS — I6900 Unspecified sequelae of nontraumatic subarachnoid hemorrhage: Secondary | ICD-10-CM | POA: Diagnosis not present

## 2019-10-02 DIAGNOSIS — M6281 Muscle weakness (generalized): Secondary | ICD-10-CM | POA: Diagnosis not present

## 2019-10-03 DIAGNOSIS — R2689 Other abnormalities of gait and mobility: Secondary | ICD-10-CM | POA: Diagnosis not present

## 2019-10-03 DIAGNOSIS — I6900 Unspecified sequelae of nontraumatic subarachnoid hemorrhage: Secondary | ICD-10-CM | POA: Diagnosis not present

## 2019-10-03 DIAGNOSIS — M6281 Muscle weakness (generalized): Secondary | ICD-10-CM | POA: Diagnosis not present

## 2019-10-04 DIAGNOSIS — I6900 Unspecified sequelae of nontraumatic subarachnoid hemorrhage: Secondary | ICD-10-CM | POA: Diagnosis not present

## 2019-10-04 DIAGNOSIS — R2689 Other abnormalities of gait and mobility: Secondary | ICD-10-CM | POA: Diagnosis not present

## 2019-10-04 DIAGNOSIS — M6281 Muscle weakness (generalized): Secondary | ICD-10-CM | POA: Diagnosis not present

## 2019-10-05 DIAGNOSIS — M6281 Muscle weakness (generalized): Secondary | ICD-10-CM | POA: Diagnosis not present

## 2019-10-05 DIAGNOSIS — R2689 Other abnormalities of gait and mobility: Secondary | ICD-10-CM | POA: Diagnosis not present

## 2019-10-05 DIAGNOSIS — I6900 Unspecified sequelae of nontraumatic subarachnoid hemorrhage: Secondary | ICD-10-CM | POA: Diagnosis not present

## 2019-10-06 DIAGNOSIS — M6281 Muscle weakness (generalized): Secondary | ICD-10-CM | POA: Diagnosis not present

## 2019-10-06 DIAGNOSIS — R2689 Other abnormalities of gait and mobility: Secondary | ICD-10-CM | POA: Diagnosis not present

## 2019-10-06 DIAGNOSIS — I6900 Unspecified sequelae of nontraumatic subarachnoid hemorrhage: Secondary | ICD-10-CM | POA: Diagnosis not present

## 2019-10-07 DIAGNOSIS — I6900 Unspecified sequelae of nontraumatic subarachnoid hemorrhage: Secondary | ICD-10-CM | POA: Diagnosis not present

## 2019-10-07 DIAGNOSIS — R2689 Other abnormalities of gait and mobility: Secondary | ICD-10-CM | POA: Diagnosis not present

## 2019-10-07 DIAGNOSIS — M6281 Muscle weakness (generalized): Secondary | ICD-10-CM | POA: Diagnosis not present

## 2019-10-09 DIAGNOSIS — I6900 Unspecified sequelae of nontraumatic subarachnoid hemorrhage: Secondary | ICD-10-CM | POA: Diagnosis not present

## 2019-10-09 DIAGNOSIS — R2689 Other abnormalities of gait and mobility: Secondary | ICD-10-CM | POA: Diagnosis not present

## 2019-10-09 DIAGNOSIS — M6281 Muscle weakness (generalized): Secondary | ICD-10-CM | POA: Diagnosis not present

## 2019-10-10 DIAGNOSIS — I6900 Unspecified sequelae of nontraumatic subarachnoid hemorrhage: Secondary | ICD-10-CM | POA: Diagnosis not present

## 2019-10-10 DIAGNOSIS — M6281 Muscle weakness (generalized): Secondary | ICD-10-CM | POA: Diagnosis not present

## 2019-10-10 DIAGNOSIS — R2689 Other abnormalities of gait and mobility: Secondary | ICD-10-CM | POA: Diagnosis not present

## 2019-10-11 DIAGNOSIS — R2689 Other abnormalities of gait and mobility: Secondary | ICD-10-CM | POA: Diagnosis not present

## 2019-10-11 DIAGNOSIS — I6900 Unspecified sequelae of nontraumatic subarachnoid hemorrhage: Secondary | ICD-10-CM | POA: Diagnosis not present

## 2019-10-11 DIAGNOSIS — M6281 Muscle weakness (generalized): Secondary | ICD-10-CM | POA: Diagnosis not present

## 2019-10-12 DIAGNOSIS — M549 Dorsalgia, unspecified: Secondary | ICD-10-CM | POA: Diagnosis not present

## 2019-10-12 DIAGNOSIS — I10 Essential (primary) hypertension: Secondary | ICD-10-CM | POA: Diagnosis not present

## 2019-10-12 DIAGNOSIS — Z299 Encounter for prophylactic measures, unspecified: Secondary | ICD-10-CM | POA: Diagnosis not present

## 2019-10-13 DIAGNOSIS — J9611 Chronic respiratory failure with hypoxia: Secondary | ICD-10-CM | POA: Diagnosis not present

## 2019-10-13 DIAGNOSIS — I5032 Chronic diastolic (congestive) heart failure: Secondary | ICD-10-CM | POA: Diagnosis not present

## 2019-10-13 DIAGNOSIS — R2689 Other abnormalities of gait and mobility: Secondary | ICD-10-CM | POA: Diagnosis not present

## 2019-10-13 DIAGNOSIS — E1165 Type 2 diabetes mellitus with hyperglycemia: Secondary | ICD-10-CM | POA: Diagnosis not present

## 2019-10-13 DIAGNOSIS — I6900 Unspecified sequelae of nontraumatic subarachnoid hemorrhage: Secondary | ICD-10-CM | POA: Diagnosis not present

## 2019-10-13 DIAGNOSIS — M6281 Muscle weakness (generalized): Secondary | ICD-10-CM | POA: Diagnosis not present

## 2019-10-14 DIAGNOSIS — M6281 Muscle weakness (generalized): Secondary | ICD-10-CM | POA: Diagnosis not present

## 2019-10-14 DIAGNOSIS — I6900 Unspecified sequelae of nontraumatic subarachnoid hemorrhage: Secondary | ICD-10-CM | POA: Diagnosis not present

## 2019-10-14 DIAGNOSIS — R2689 Other abnormalities of gait and mobility: Secondary | ICD-10-CM | POA: Diagnosis not present

## 2019-10-16 DIAGNOSIS — R2689 Other abnormalities of gait and mobility: Secondary | ICD-10-CM | POA: Diagnosis not present

## 2019-10-16 DIAGNOSIS — I6900 Unspecified sequelae of nontraumatic subarachnoid hemorrhage: Secondary | ICD-10-CM | POA: Diagnosis not present

## 2019-10-16 DIAGNOSIS — M6281 Muscle weakness (generalized): Secondary | ICD-10-CM | POA: Diagnosis not present

## 2019-10-17 DIAGNOSIS — R2689 Other abnormalities of gait and mobility: Secondary | ICD-10-CM | POA: Diagnosis not present

## 2019-10-17 DIAGNOSIS — M6281 Muscle weakness (generalized): Secondary | ICD-10-CM | POA: Diagnosis not present

## 2019-10-17 DIAGNOSIS — I6900 Unspecified sequelae of nontraumatic subarachnoid hemorrhage: Secondary | ICD-10-CM | POA: Diagnosis not present

## 2019-10-18 DIAGNOSIS — I6900 Unspecified sequelae of nontraumatic subarachnoid hemorrhage: Secondary | ICD-10-CM | POA: Diagnosis not present

## 2019-10-18 DIAGNOSIS — M6281 Muscle weakness (generalized): Secondary | ICD-10-CM | POA: Diagnosis not present

## 2019-10-18 DIAGNOSIS — R2689 Other abnormalities of gait and mobility: Secondary | ICD-10-CM | POA: Diagnosis not present

## 2019-10-19 DIAGNOSIS — M6281 Muscle weakness (generalized): Secondary | ICD-10-CM | POA: Diagnosis not present

## 2019-10-19 DIAGNOSIS — I6900 Unspecified sequelae of nontraumatic subarachnoid hemorrhage: Secondary | ICD-10-CM | POA: Diagnosis not present

## 2019-10-19 DIAGNOSIS — R2689 Other abnormalities of gait and mobility: Secondary | ICD-10-CM | POA: Diagnosis not present

## 2019-10-20 DIAGNOSIS — I6900 Unspecified sequelae of nontraumatic subarachnoid hemorrhage: Secondary | ICD-10-CM | POA: Diagnosis not present

## 2019-10-20 DIAGNOSIS — M6281 Muscle weakness (generalized): Secondary | ICD-10-CM | POA: Diagnosis not present

## 2019-10-20 DIAGNOSIS — R2689 Other abnormalities of gait and mobility: Secondary | ICD-10-CM | POA: Diagnosis not present

## 2019-10-23 DIAGNOSIS — R2689 Other abnormalities of gait and mobility: Secondary | ICD-10-CM | POA: Diagnosis not present

## 2019-10-23 DIAGNOSIS — M6281 Muscle weakness (generalized): Secondary | ICD-10-CM | POA: Diagnosis not present

## 2019-10-23 DIAGNOSIS — I6900 Unspecified sequelae of nontraumatic subarachnoid hemorrhage: Secondary | ICD-10-CM | POA: Diagnosis not present

## 2019-10-24 DIAGNOSIS — R140 Abdominal distension (gaseous): Secondary | ICD-10-CM | POA: Diagnosis not present

## 2019-10-24 DIAGNOSIS — Z299 Encounter for prophylactic measures, unspecified: Secondary | ICD-10-CM | POA: Diagnosis not present

## 2019-10-24 DIAGNOSIS — M6281 Muscle weakness (generalized): Secondary | ICD-10-CM | POA: Diagnosis not present

## 2019-10-24 DIAGNOSIS — R2689 Other abnormalities of gait and mobility: Secondary | ICD-10-CM | POA: Diagnosis not present

## 2019-10-24 DIAGNOSIS — J449 Chronic obstructive pulmonary disease, unspecified: Secondary | ICD-10-CM | POA: Diagnosis not present

## 2019-10-24 DIAGNOSIS — I6900 Unspecified sequelae of nontraumatic subarachnoid hemorrhage: Secondary | ICD-10-CM | POA: Diagnosis not present

## 2019-10-24 DIAGNOSIS — S98111A Complete traumatic amputation of right great toe, initial encounter: Secondary | ICD-10-CM | POA: Diagnosis not present

## 2019-10-25 DIAGNOSIS — M6281 Muscle weakness (generalized): Secondary | ICD-10-CM | POA: Diagnosis not present

## 2019-10-25 DIAGNOSIS — I6900 Unspecified sequelae of nontraumatic subarachnoid hemorrhage: Secondary | ICD-10-CM | POA: Diagnosis not present

## 2019-10-25 DIAGNOSIS — R2689 Other abnormalities of gait and mobility: Secondary | ICD-10-CM | POA: Diagnosis not present

## 2019-10-26 DIAGNOSIS — I5032 Chronic diastolic (congestive) heart failure: Secondary | ICD-10-CM | POA: Diagnosis not present

## 2019-10-26 DIAGNOSIS — E1165 Type 2 diabetes mellitus with hyperglycemia: Secondary | ICD-10-CM | POA: Diagnosis not present

## 2019-10-26 DIAGNOSIS — J449 Chronic obstructive pulmonary disease, unspecified: Secondary | ICD-10-CM | POA: Diagnosis not present

## 2019-10-26 DIAGNOSIS — I6900 Unspecified sequelae of nontraumatic subarachnoid hemorrhage: Secondary | ICD-10-CM | POA: Diagnosis not present

## 2019-10-26 DIAGNOSIS — R2689 Other abnormalities of gait and mobility: Secondary | ICD-10-CM | POA: Diagnosis not present

## 2019-10-26 DIAGNOSIS — I10 Essential (primary) hypertension: Secondary | ICD-10-CM | POA: Diagnosis not present

## 2019-10-26 DIAGNOSIS — Z299 Encounter for prophylactic measures, unspecified: Secondary | ICD-10-CM | POA: Diagnosis not present

## 2019-10-26 DIAGNOSIS — M6281 Muscle weakness (generalized): Secondary | ICD-10-CM | POA: Diagnosis not present

## 2019-10-27 DIAGNOSIS — R2689 Other abnormalities of gait and mobility: Secondary | ICD-10-CM | POA: Diagnosis not present

## 2019-10-27 DIAGNOSIS — M6281 Muscle weakness (generalized): Secondary | ICD-10-CM | POA: Diagnosis not present

## 2019-10-27 DIAGNOSIS — I6900 Unspecified sequelae of nontraumatic subarachnoid hemorrhage: Secondary | ICD-10-CM | POA: Diagnosis not present

## 2019-10-28 DIAGNOSIS — M6281 Muscle weakness (generalized): Secondary | ICD-10-CM | POA: Diagnosis not present

## 2019-10-28 DIAGNOSIS — R2689 Other abnormalities of gait and mobility: Secondary | ICD-10-CM | POA: Diagnosis not present

## 2019-10-28 DIAGNOSIS — I6900 Unspecified sequelae of nontraumatic subarachnoid hemorrhage: Secondary | ICD-10-CM | POA: Diagnosis not present

## 2019-10-28 DIAGNOSIS — I10 Essential (primary) hypertension: Secondary | ICD-10-CM | POA: Diagnosis not present

## 2019-10-28 DIAGNOSIS — J449 Chronic obstructive pulmonary disease, unspecified: Secondary | ICD-10-CM | POA: Diagnosis not present

## 2019-10-30 DIAGNOSIS — R2689 Other abnormalities of gait and mobility: Secondary | ICD-10-CM | POA: Diagnosis not present

## 2019-10-30 DIAGNOSIS — M6281 Muscle weakness (generalized): Secondary | ICD-10-CM | POA: Diagnosis not present

## 2019-10-30 DIAGNOSIS — I6900 Unspecified sequelae of nontraumatic subarachnoid hemorrhage: Secondary | ICD-10-CM | POA: Diagnosis not present

## 2019-10-31 DIAGNOSIS — E1142 Type 2 diabetes mellitus with diabetic polyneuropathy: Secondary | ICD-10-CM | POA: Diagnosis not present

## 2019-10-31 DIAGNOSIS — I10 Essential (primary) hypertension: Secondary | ICD-10-CM | POA: Diagnosis not present

## 2019-10-31 DIAGNOSIS — I6900 Unspecified sequelae of nontraumatic subarachnoid hemorrhage: Secondary | ICD-10-CM | POA: Diagnosis not present

## 2019-10-31 DIAGNOSIS — E1165 Type 2 diabetes mellitus with hyperglycemia: Secondary | ICD-10-CM | POA: Diagnosis not present

## 2019-10-31 DIAGNOSIS — R2689 Other abnormalities of gait and mobility: Secondary | ICD-10-CM | POA: Diagnosis not present

## 2019-10-31 DIAGNOSIS — D473 Essential (hemorrhagic) thrombocythemia: Secondary | ICD-10-CM | POA: Diagnosis not present

## 2019-10-31 DIAGNOSIS — M6281 Muscle weakness (generalized): Secondary | ICD-10-CM | POA: Diagnosis not present

## 2019-10-31 DIAGNOSIS — Z299 Encounter for prophylactic measures, unspecified: Secondary | ICD-10-CM | POA: Diagnosis not present

## 2019-11-01 DIAGNOSIS — I6900 Unspecified sequelae of nontraumatic subarachnoid hemorrhage: Secondary | ICD-10-CM | POA: Diagnosis not present

## 2019-11-01 DIAGNOSIS — R2689 Other abnormalities of gait and mobility: Secondary | ICD-10-CM | POA: Diagnosis not present

## 2019-11-01 DIAGNOSIS — M6281 Muscle weakness (generalized): Secondary | ICD-10-CM | POA: Diagnosis not present

## 2019-11-02 DIAGNOSIS — R2689 Other abnormalities of gait and mobility: Secondary | ICD-10-CM | POA: Diagnosis not present

## 2019-11-02 DIAGNOSIS — I6900 Unspecified sequelae of nontraumatic subarachnoid hemorrhage: Secondary | ICD-10-CM | POA: Diagnosis not present

## 2019-11-02 DIAGNOSIS — M6281 Muscle weakness (generalized): Secondary | ICD-10-CM | POA: Diagnosis not present

## 2019-11-03 DIAGNOSIS — M6281 Muscle weakness (generalized): Secondary | ICD-10-CM | POA: Diagnosis not present

## 2019-11-03 DIAGNOSIS — R2689 Other abnormalities of gait and mobility: Secondary | ICD-10-CM | POA: Diagnosis not present

## 2019-11-03 DIAGNOSIS — I6900 Unspecified sequelae of nontraumatic subarachnoid hemorrhage: Secondary | ICD-10-CM | POA: Diagnosis not present

## 2019-11-06 DIAGNOSIS — L110 Acquired keratosis follicularis: Secondary | ICD-10-CM | POA: Diagnosis not present

## 2019-11-07 DIAGNOSIS — M6281 Muscle weakness (generalized): Secondary | ICD-10-CM | POA: Diagnosis not present

## 2019-11-07 DIAGNOSIS — I6900 Unspecified sequelae of nontraumatic subarachnoid hemorrhage: Secondary | ICD-10-CM | POA: Diagnosis not present

## 2019-11-07 DIAGNOSIS — R2689 Other abnormalities of gait and mobility: Secondary | ICD-10-CM | POA: Diagnosis not present

## 2019-11-08 DIAGNOSIS — I6900 Unspecified sequelae of nontraumatic subarachnoid hemorrhage: Secondary | ICD-10-CM | POA: Diagnosis not present

## 2019-11-08 DIAGNOSIS — M6281 Muscle weakness (generalized): Secondary | ICD-10-CM | POA: Diagnosis not present

## 2019-11-08 DIAGNOSIS — R2689 Other abnormalities of gait and mobility: Secondary | ICD-10-CM | POA: Diagnosis not present

## 2019-11-09 DIAGNOSIS — M6281 Muscle weakness (generalized): Secondary | ICD-10-CM | POA: Diagnosis not present

## 2019-11-09 DIAGNOSIS — I6900 Unspecified sequelae of nontraumatic subarachnoid hemorrhage: Secondary | ICD-10-CM | POA: Diagnosis not present

## 2019-11-09 DIAGNOSIS — R2689 Other abnormalities of gait and mobility: Secondary | ICD-10-CM | POA: Diagnosis not present

## 2019-11-10 DIAGNOSIS — I6900 Unspecified sequelae of nontraumatic subarachnoid hemorrhage: Secondary | ICD-10-CM | POA: Diagnosis not present

## 2019-11-10 DIAGNOSIS — R2689 Other abnormalities of gait and mobility: Secondary | ICD-10-CM | POA: Diagnosis not present

## 2019-11-10 DIAGNOSIS — M6281 Muscle weakness (generalized): Secondary | ICD-10-CM | POA: Diagnosis not present

## 2019-11-14 DIAGNOSIS — I6900 Unspecified sequelae of nontraumatic subarachnoid hemorrhage: Secondary | ICD-10-CM | POA: Diagnosis not present

## 2019-11-14 DIAGNOSIS — R2689 Other abnormalities of gait and mobility: Secondary | ICD-10-CM | POA: Diagnosis not present

## 2019-11-14 DIAGNOSIS — M6281 Muscle weakness (generalized): Secondary | ICD-10-CM | POA: Diagnosis not present

## 2019-11-15 DIAGNOSIS — M6281 Muscle weakness (generalized): Secondary | ICD-10-CM | POA: Diagnosis not present

## 2019-11-15 DIAGNOSIS — R2689 Other abnormalities of gait and mobility: Secondary | ICD-10-CM | POA: Diagnosis not present

## 2019-11-15 DIAGNOSIS — I6900 Unspecified sequelae of nontraumatic subarachnoid hemorrhage: Secondary | ICD-10-CM | POA: Diagnosis not present

## 2019-11-16 DIAGNOSIS — M6281 Muscle weakness (generalized): Secondary | ICD-10-CM | POA: Diagnosis not present

## 2019-11-16 DIAGNOSIS — I6900 Unspecified sequelae of nontraumatic subarachnoid hemorrhage: Secondary | ICD-10-CM | POA: Diagnosis not present

## 2019-11-16 DIAGNOSIS — R2689 Other abnormalities of gait and mobility: Secondary | ICD-10-CM | POA: Diagnosis not present

## 2019-11-21 DIAGNOSIS — M6281 Muscle weakness (generalized): Secondary | ICD-10-CM | POA: Diagnosis not present

## 2019-11-21 DIAGNOSIS — R2689 Other abnormalities of gait and mobility: Secondary | ICD-10-CM | POA: Diagnosis not present

## 2019-11-21 DIAGNOSIS — I6900 Unspecified sequelae of nontraumatic subarachnoid hemorrhage: Secondary | ICD-10-CM | POA: Diagnosis not present

## 2019-11-23 DIAGNOSIS — I10 Essential (primary) hypertension: Secondary | ICD-10-CM | POA: Diagnosis not present

## 2019-11-23 DIAGNOSIS — Z299 Encounter for prophylactic measures, unspecified: Secondary | ICD-10-CM | POA: Diagnosis not present

## 2019-11-23 DIAGNOSIS — E1165 Type 2 diabetes mellitus with hyperglycemia: Secondary | ICD-10-CM | POA: Diagnosis not present

## 2019-11-23 DIAGNOSIS — K7460 Unspecified cirrhosis of liver: Secondary | ICD-10-CM | POA: Diagnosis not present

## 2019-11-25 DIAGNOSIS — R2689 Other abnormalities of gait and mobility: Secondary | ICD-10-CM | POA: Diagnosis not present

## 2019-11-25 DIAGNOSIS — M6281 Muscle weakness (generalized): Secondary | ICD-10-CM | POA: Diagnosis not present

## 2019-11-25 DIAGNOSIS — I6900 Unspecified sequelae of nontraumatic subarachnoid hemorrhage: Secondary | ICD-10-CM | POA: Diagnosis not present

## 2019-11-27 DIAGNOSIS — M6281 Muscle weakness (generalized): Secondary | ICD-10-CM | POA: Diagnosis not present

## 2019-11-27 DIAGNOSIS — R2689 Other abnormalities of gait and mobility: Secondary | ICD-10-CM | POA: Diagnosis not present

## 2019-11-27 DIAGNOSIS — I6900 Unspecified sequelae of nontraumatic subarachnoid hemorrhage: Secondary | ICD-10-CM | POA: Diagnosis not present

## 2019-12-19 DIAGNOSIS — I6900 Unspecified sequelae of nontraumatic subarachnoid hemorrhage: Secondary | ICD-10-CM | POA: Diagnosis not present

## 2019-12-21 DIAGNOSIS — Z299 Encounter for prophylactic measures, unspecified: Secondary | ICD-10-CM | POA: Diagnosis not present

## 2019-12-21 DIAGNOSIS — Z23 Encounter for immunization: Secondary | ICD-10-CM | POA: Diagnosis not present

## 2019-12-21 DIAGNOSIS — I10 Essential (primary) hypertension: Secondary | ICD-10-CM | POA: Diagnosis not present

## 2019-12-21 DIAGNOSIS — E1165 Type 2 diabetes mellitus with hyperglycemia: Secondary | ICD-10-CM | POA: Diagnosis not present

## 2019-12-21 DIAGNOSIS — I6900 Unspecified sequelae of nontraumatic subarachnoid hemorrhage: Secondary | ICD-10-CM | POA: Diagnosis not present

## 2019-12-26 DIAGNOSIS — R262 Difficulty in walking, not elsewhere classified: Secondary | ICD-10-CM | POA: Diagnosis not present

## 2019-12-26 DIAGNOSIS — B351 Tinea unguium: Secondary | ICD-10-CM | POA: Diagnosis not present

## 2019-12-26 DIAGNOSIS — I739 Peripheral vascular disease, unspecified: Secondary | ICD-10-CM | POA: Diagnosis not present

## 2019-12-26 DIAGNOSIS — R5383 Other fatigue: Secondary | ICD-10-CM | POA: Diagnosis not present

## 2019-12-26 DIAGNOSIS — E1140 Type 2 diabetes mellitus with diabetic neuropathy, unspecified: Secondary | ICD-10-CM | POA: Diagnosis not present

## 2019-12-26 DIAGNOSIS — Z794 Long term (current) use of insulin: Secondary | ICD-10-CM | POA: Diagnosis not present

## 2019-12-26 DIAGNOSIS — E1165 Type 2 diabetes mellitus with hyperglycemia: Secondary | ICD-10-CM | POA: Diagnosis not present

## 2019-12-26 DIAGNOSIS — Z299 Encounter for prophylactic measures, unspecified: Secondary | ICD-10-CM | POA: Diagnosis not present

## 2019-12-28 DIAGNOSIS — E1165 Type 2 diabetes mellitus with hyperglycemia: Secondary | ICD-10-CM | POA: Diagnosis not present

## 2019-12-28 DIAGNOSIS — Z299 Encounter for prophylactic measures, unspecified: Secondary | ICD-10-CM | POA: Diagnosis not present

## 2019-12-28 DIAGNOSIS — I6900 Unspecified sequelae of nontraumatic subarachnoid hemorrhage: Secondary | ICD-10-CM | POA: Diagnosis not present

## 2019-12-28 DIAGNOSIS — R5383 Other fatigue: Secondary | ICD-10-CM | POA: Diagnosis not present

## 2019-12-28 DIAGNOSIS — I739 Peripheral vascular disease, unspecified: Secondary | ICD-10-CM | POA: Diagnosis not present

## 2019-12-29 DIAGNOSIS — I5032 Chronic diastolic (congestive) heart failure: Secondary | ICD-10-CM | POA: Diagnosis not present

## 2019-12-29 DIAGNOSIS — E11319 Type 2 diabetes mellitus with unspecified diabetic retinopathy without macular edema: Secondary | ICD-10-CM | POA: Diagnosis not present

## 2020-01-02 DIAGNOSIS — D75839 Thrombocytosis, unspecified: Secondary | ICD-10-CM | POA: Diagnosis not present

## 2020-01-02 DIAGNOSIS — D649 Anemia, unspecified: Secondary | ICD-10-CM | POA: Diagnosis not present

## 2020-01-02 DIAGNOSIS — Z299 Encounter for prophylactic measures, unspecified: Secondary | ICD-10-CM | POA: Diagnosis not present

## 2020-01-02 DIAGNOSIS — K7460 Unspecified cirrhosis of liver: Secondary | ICD-10-CM | POA: Diagnosis not present

## 2020-01-02 DIAGNOSIS — D692 Other nonthrombocytopenic purpura: Secondary | ICD-10-CM | POA: Diagnosis not present

## 2020-01-04 DIAGNOSIS — D509 Iron deficiency anemia, unspecified: Secondary | ICD-10-CM | POA: Diagnosis not present

## 2020-01-04 DIAGNOSIS — I739 Peripheral vascular disease, unspecified: Secondary | ICD-10-CM | POA: Diagnosis not present

## 2020-01-04 DIAGNOSIS — I10 Essential (primary) hypertension: Secondary | ICD-10-CM | POA: Diagnosis not present

## 2020-01-04 DIAGNOSIS — I6900 Unspecified sequelae of nontraumatic subarachnoid hemorrhage: Secondary | ICD-10-CM | POA: Diagnosis not present

## 2020-01-04 DIAGNOSIS — Z299 Encounter for prophylactic measures, unspecified: Secondary | ICD-10-CM | POA: Diagnosis not present

## 2020-01-04 DIAGNOSIS — K7460 Unspecified cirrhosis of liver: Secondary | ICD-10-CM | POA: Diagnosis not present

## 2020-01-04 DIAGNOSIS — J9611 Chronic respiratory failure with hypoxia: Secondary | ICD-10-CM | POA: Diagnosis not present

## 2020-01-08 DIAGNOSIS — I6900 Unspecified sequelae of nontraumatic subarachnoid hemorrhage: Secondary | ICD-10-CM | POA: Diagnosis not present

## 2020-01-09 DIAGNOSIS — Z299 Encounter for prophylactic measures, unspecified: Secondary | ICD-10-CM | POA: Diagnosis not present

## 2020-01-09 DIAGNOSIS — I10 Essential (primary) hypertension: Secondary | ICD-10-CM | POA: Diagnosis not present

## 2020-01-09 DIAGNOSIS — S98111A Complete traumatic amputation of right great toe, initial encounter: Secondary | ICD-10-CM | POA: Diagnosis not present

## 2020-01-09 DIAGNOSIS — E1142 Type 2 diabetes mellitus with diabetic polyneuropathy: Secondary | ICD-10-CM | POA: Diagnosis not present

## 2020-01-09 DIAGNOSIS — E1165 Type 2 diabetes mellitus with hyperglycemia: Secondary | ICD-10-CM | POA: Diagnosis not present

## 2020-01-10 DIAGNOSIS — I6900 Unspecified sequelae of nontraumatic subarachnoid hemorrhage: Secondary | ICD-10-CM | POA: Diagnosis not present

## 2020-01-16 DIAGNOSIS — E1151 Type 2 diabetes mellitus with diabetic peripheral angiopathy without gangrene: Secondary | ICD-10-CM | POA: Diagnosis not present

## 2020-01-16 DIAGNOSIS — Z4800 Encounter for change or removal of nonsurgical wound dressing: Secondary | ICD-10-CM | POA: Diagnosis not present

## 2020-01-16 DIAGNOSIS — I6900 Unspecified sequelae of nontraumatic subarachnoid hemorrhage: Secondary | ICD-10-CM | POA: Diagnosis not present

## 2020-01-16 DIAGNOSIS — Z87891 Personal history of nicotine dependence: Secondary | ICD-10-CM | POA: Diagnosis not present

## 2020-01-16 DIAGNOSIS — Z89421 Acquired absence of other right toe(s): Secondary | ICD-10-CM | POA: Diagnosis not present

## 2020-01-16 DIAGNOSIS — L89153 Pressure ulcer of sacral region, stage 3: Secondary | ICD-10-CM | POA: Diagnosis not present

## 2020-01-16 DIAGNOSIS — L89159 Pressure ulcer of sacral region, unspecified stage: Secondary | ICD-10-CM | POA: Diagnosis not present

## 2020-01-26 DIAGNOSIS — Z87891 Personal history of nicotine dependence: Secondary | ICD-10-CM | POA: Diagnosis not present

## 2020-01-26 DIAGNOSIS — Z4800 Encounter for change or removal of nonsurgical wound dressing: Secondary | ICD-10-CM | POA: Diagnosis not present

## 2020-01-26 DIAGNOSIS — E1151 Type 2 diabetes mellitus with diabetic peripheral angiopathy without gangrene: Secondary | ICD-10-CM | POA: Diagnosis not present

## 2020-01-26 DIAGNOSIS — L89159 Pressure ulcer of sacral region, unspecified stage: Secondary | ICD-10-CM | POA: Diagnosis not present

## 2020-01-26 DIAGNOSIS — L89153 Pressure ulcer of sacral region, stage 3: Secondary | ICD-10-CM | POA: Diagnosis not present

## 2020-01-26 DIAGNOSIS — T148XXA Other injury of unspecified body region, initial encounter: Secondary | ICD-10-CM | POA: Diagnosis not present

## 2020-01-26 DIAGNOSIS — Z89421 Acquired absence of other right toe(s): Secondary | ICD-10-CM | POA: Diagnosis not present

## 2020-01-29 DIAGNOSIS — L8990 Pressure ulcer of unspecified site, unspecified stage: Secondary | ICD-10-CM | POA: Diagnosis not present

## 2020-02-01 DIAGNOSIS — I6900 Unspecified sequelae of nontraumatic subarachnoid hemorrhage: Secondary | ICD-10-CM | POA: Diagnosis not present

## 2020-02-07 DIAGNOSIS — I6900 Unspecified sequelae of nontraumatic subarachnoid hemorrhage: Secondary | ICD-10-CM | POA: Diagnosis not present

## 2020-02-08 DIAGNOSIS — I6900 Unspecified sequelae of nontraumatic subarachnoid hemorrhage: Secondary | ICD-10-CM | POA: Diagnosis not present

## 2020-02-09 DIAGNOSIS — Z89421 Acquired absence of other right toe(s): Secondary | ICD-10-CM | POA: Diagnosis not present

## 2020-02-09 DIAGNOSIS — E1151 Type 2 diabetes mellitus with diabetic peripheral angiopathy without gangrene: Secondary | ICD-10-CM | POA: Diagnosis not present

## 2020-02-09 DIAGNOSIS — L89153 Pressure ulcer of sacral region, stage 3: Secondary | ICD-10-CM | POA: Diagnosis not present

## 2020-02-09 DIAGNOSIS — Z4800 Encounter for change or removal of nonsurgical wound dressing: Secondary | ICD-10-CM | POA: Diagnosis not present

## 2020-02-09 DIAGNOSIS — Z87891 Personal history of nicotine dependence: Secondary | ICD-10-CM | POA: Diagnosis not present

## 2020-02-12 DIAGNOSIS — I6900 Unspecified sequelae of nontraumatic subarachnoid hemorrhage: Secondary | ICD-10-CM | POA: Diagnosis not present

## 2020-02-15 DIAGNOSIS — I10 Essential (primary) hypertension: Secondary | ICD-10-CM | POA: Diagnosis not present

## 2020-02-15 DIAGNOSIS — D509 Iron deficiency anemia, unspecified: Secondary | ICD-10-CM | POA: Diagnosis not present

## 2020-02-15 DIAGNOSIS — L8990 Pressure ulcer of unspecified site, unspecified stage: Secondary | ICD-10-CM | POA: Diagnosis not present

## 2020-02-15 DIAGNOSIS — Z299 Encounter for prophylactic measures, unspecified: Secondary | ICD-10-CM | POA: Diagnosis not present

## 2020-02-15 DIAGNOSIS — J9611 Chronic respiratory failure with hypoxia: Secondary | ICD-10-CM | POA: Diagnosis not present

## 2020-02-15 DIAGNOSIS — L98429 Non-pressure chronic ulcer of back with unspecified severity: Secondary | ICD-10-CM | POA: Diagnosis not present

## 2020-02-18 DIAGNOSIS — I6900 Unspecified sequelae of nontraumatic subarachnoid hemorrhage: Secondary | ICD-10-CM | POA: Diagnosis not present

## 2020-02-19 DIAGNOSIS — I6900 Unspecified sequelae of nontraumatic subarachnoid hemorrhage: Secondary | ICD-10-CM | POA: Diagnosis not present

## 2020-02-20 DIAGNOSIS — E1142 Type 2 diabetes mellitus with diabetic polyneuropathy: Secondary | ICD-10-CM | POA: Diagnosis not present

## 2020-02-20 DIAGNOSIS — D692 Other nonthrombocytopenic purpura: Secondary | ICD-10-CM | POA: Diagnosis not present

## 2020-02-21 DIAGNOSIS — I6900 Unspecified sequelae of nontraumatic subarachnoid hemorrhage: Secondary | ICD-10-CM | POA: Diagnosis not present

## 2020-03-01 DIAGNOSIS — E1151 Type 2 diabetes mellitus with diabetic peripheral angiopathy without gangrene: Secondary | ICD-10-CM | POA: Diagnosis not present

## 2020-03-01 DIAGNOSIS — Z89421 Acquired absence of other right toe(s): Secondary | ICD-10-CM | POA: Diagnosis not present

## 2020-03-01 DIAGNOSIS — Z87891 Personal history of nicotine dependence: Secondary | ICD-10-CM | POA: Diagnosis not present

## 2020-03-01 DIAGNOSIS — L89153 Pressure ulcer of sacral region, stage 3: Secondary | ICD-10-CM | POA: Diagnosis not present

## 2020-03-01 DIAGNOSIS — Z79899 Other long term (current) drug therapy: Secondary | ICD-10-CM | POA: Diagnosis not present

## 2020-03-12 DIAGNOSIS — Z299 Encounter for prophylactic measures, unspecified: Secondary | ICD-10-CM | POA: Diagnosis not present

## 2020-03-12 DIAGNOSIS — E1142 Type 2 diabetes mellitus with diabetic polyneuropathy: Secondary | ICD-10-CM | POA: Diagnosis not present

## 2020-03-12 DIAGNOSIS — S98111A Complete traumatic amputation of right great toe, initial encounter: Secondary | ICD-10-CM | POA: Diagnosis not present

## 2020-03-12 DIAGNOSIS — E1165 Type 2 diabetes mellitus with hyperglycemia: Secondary | ICD-10-CM | POA: Diagnosis not present

## 2020-03-21 DIAGNOSIS — I110 Hypertensive heart disease with heart failure: Secondary | ICD-10-CM | POA: Diagnosis not present

## 2020-04-16 ENCOUNTER — Ambulatory Visit (HOSPITAL_BASED_OUTPATIENT_CLINIC_OR_DEPARTMENT_OTHER): Payer: Medicare Other | Admitting: Internal Medicine

## 2020-04-16 ENCOUNTER — Encounter (HOSPITAL_BASED_OUTPATIENT_CLINIC_OR_DEPARTMENT_OTHER): Payer: Medicare Other | Admitting: Internal Medicine

## 2020-04-17 ENCOUNTER — Encounter (HOSPITAL_BASED_OUTPATIENT_CLINIC_OR_DEPARTMENT_OTHER): Payer: Medicare Other | Attending: Internal Medicine | Admitting: Physician Assistant

## 2020-04-17 ENCOUNTER — Other Ambulatory Visit: Payer: Self-pay

## 2020-04-17 DIAGNOSIS — I5042 Chronic combined systolic (congestive) and diastolic (congestive) heart failure: Secondary | ICD-10-CM | POA: Insufficient documentation

## 2020-04-17 DIAGNOSIS — Z8249 Family history of ischemic heart disease and other diseases of the circulatory system: Secondary | ICD-10-CM | POA: Insufficient documentation

## 2020-04-17 DIAGNOSIS — Z9981 Dependence on supplemental oxygen: Secondary | ICD-10-CM | POA: Diagnosis not present

## 2020-04-17 DIAGNOSIS — Z833 Family history of diabetes mellitus: Secondary | ICD-10-CM | POA: Insufficient documentation

## 2020-04-17 DIAGNOSIS — Z853 Personal history of malignant neoplasm of breast: Secondary | ICD-10-CM | POA: Diagnosis not present

## 2020-04-17 DIAGNOSIS — Z87891 Personal history of nicotine dependence: Secondary | ICD-10-CM | POA: Diagnosis not present

## 2020-04-17 DIAGNOSIS — F0150 Vascular dementia without behavioral disturbance: Secondary | ICD-10-CM | POA: Diagnosis not present

## 2020-04-17 DIAGNOSIS — L89153 Pressure ulcer of sacral region, stage 3: Secondary | ICD-10-CM | POA: Diagnosis not present

## 2020-04-17 DIAGNOSIS — K7460 Unspecified cirrhosis of liver: Secondary | ICD-10-CM | POA: Insufficient documentation

## 2020-04-17 DIAGNOSIS — Z9012 Acquired absence of left breast and nipple: Secondary | ICD-10-CM | POA: Diagnosis not present

## 2020-04-17 DIAGNOSIS — I110 Hypertensive heart disease with heart failure: Secondary | ICD-10-CM | POA: Insufficient documentation

## 2020-04-17 DIAGNOSIS — J449 Chronic obstructive pulmonary disease, unspecified: Secondary | ICD-10-CM | POA: Insufficient documentation

## 2020-04-17 DIAGNOSIS — E11622 Type 2 diabetes mellitus with other skin ulcer: Secondary | ICD-10-CM | POA: Diagnosis not present

## 2020-04-18 DIAGNOSIS — Z299 Encounter for prophylactic measures, unspecified: Secondary | ICD-10-CM | POA: Diagnosis not present

## 2020-04-18 DIAGNOSIS — I5032 Chronic diastolic (congestive) heart failure: Secondary | ICD-10-CM | POA: Diagnosis not present

## 2020-04-18 DIAGNOSIS — J449 Chronic obstructive pulmonary disease, unspecified: Secondary | ICD-10-CM | POA: Diagnosis not present

## 2020-04-19 DIAGNOSIS — M533 Sacrococcygeal disorders, not elsewhere classified: Secondary | ICD-10-CM | POA: Diagnosis not present

## 2020-04-19 DIAGNOSIS — L89153 Pressure ulcer of sacral region, stage 3: Secondary | ICD-10-CM | POA: Diagnosis not present

## 2020-04-19 DIAGNOSIS — L89159 Pressure ulcer of sacral region, unspecified stage: Secondary | ICD-10-CM | POA: Diagnosis not present

## 2020-04-25 DIAGNOSIS — Z299 Encounter for prophylactic measures, unspecified: Secondary | ICD-10-CM | POA: Diagnosis not present

## 2020-04-25 DIAGNOSIS — I10 Essential (primary) hypertension: Secondary | ICD-10-CM | POA: Diagnosis not present

## 2020-04-25 DIAGNOSIS — E1165 Type 2 diabetes mellitus with hyperglycemia: Secondary | ICD-10-CM | POA: Diagnosis not present

## 2020-04-25 DIAGNOSIS — L98429 Non-pressure chronic ulcer of back with unspecified severity: Secondary | ICD-10-CM | POA: Diagnosis not present

## 2020-04-25 DIAGNOSIS — J9611 Chronic respiratory failure with hypoxia: Secondary | ICD-10-CM | POA: Diagnosis not present

## 2020-04-30 DIAGNOSIS — E1142 Type 2 diabetes mellitus with diabetic polyneuropathy: Secondary | ICD-10-CM | POA: Diagnosis not present

## 2020-04-30 DIAGNOSIS — Z0000 Encounter for general adult medical examination without abnormal findings: Secondary | ICD-10-CM | POA: Diagnosis not present

## 2020-04-30 DIAGNOSIS — I10 Essential (primary) hypertension: Secondary | ICD-10-CM | POA: Diagnosis not present

## 2020-04-30 DIAGNOSIS — K7460 Unspecified cirrhosis of liver: Secondary | ICD-10-CM | POA: Diagnosis not present

## 2020-04-30 DIAGNOSIS — Z299 Encounter for prophylactic measures, unspecified: Secondary | ICD-10-CM | POA: Diagnosis not present

## 2020-04-30 DIAGNOSIS — I739 Peripheral vascular disease, unspecified: Secondary | ICD-10-CM | POA: Diagnosis not present

## 2020-05-07 DIAGNOSIS — K7460 Unspecified cirrhosis of liver: Secondary | ICD-10-CM | POA: Diagnosis not present

## 2020-05-07 DIAGNOSIS — E1165 Type 2 diabetes mellitus with hyperglycemia: Secondary | ICD-10-CM | POA: Diagnosis not present

## 2020-05-07 DIAGNOSIS — S98111A Complete traumatic amputation of right great toe, initial encounter: Secondary | ICD-10-CM | POA: Diagnosis not present

## 2020-05-07 DIAGNOSIS — E1142 Type 2 diabetes mellitus with diabetic polyneuropathy: Secondary | ICD-10-CM | POA: Diagnosis not present

## 2020-05-07 DIAGNOSIS — Z299 Encounter for prophylactic measures, unspecified: Secondary | ICD-10-CM | POA: Diagnosis not present

## 2020-05-07 NOTE — Progress Notes (Signed)
LUCILLA, PETRENKO ([id number]) Visit Report for 04/17/2020 Abuse/Suicide Risk Screen Details Patient Name: Date of Service: LIPFO RDLtanya, Bayley. 04/17/2020 2:45 PM Medical Record Number: [id number] Patient Account Number: [id number] Date of Birth/Sex: Treating RN: Sep 17, 1945 (75 y.o. Female) Levan Hurst Primary Care Rilyn Upshaw: Monico Blitz Other Clinician: Referring Ivy Meriwether: Treating Katira Dumais/Extender: Valerie Roys, Ashish Weeks in Treatment: 0 Abuse/Suicide Risk Screen Items Answer ABUSE RISK SCREEN: Has anyone close to you tried to hurt or harm you recentlyo No Do you feel uncomfortable with anyone in your familyo No Has anyone forced you do things that you didnt want to doo No Electronic Signature(s) Signed: 05/07/2020 4:03:25 PM By: Levan Hurst RN, BSN Entered By: Levan Hurst on 04/17/2020 15:18:55 -------------------------------------------------------------------------------- Activities of Daily Living Details Patient Name: Date of Service: [name], KARIEL SKILLMAN. 04/17/2020 2:45 PM Medical Record Number: [id number] Patient Account Number: [id number] Date of Birth/Sex: Treating RN: [birth year] (75 y.o. Female) Levan Hurst Primary Care Alencia Gordon: Monico Blitz Other Clinician: Referring Davis Ambrosini: Treating Pesach Frisch/Extender: Valerie Roys, Ashish Weeks in Treatment: 0 Activities of Daily Living Items Answer Activities of Daily Living (Please select one for each item) Drive Automobile Not Able T Medications ake Need Assistance Use T elephone Need Assistance Care for Appearance Need Assistance Use T oilet Need Assistance Bath / Shower Need Assistance Dress Self Need Assistance Feed Self Completely Able Walk Not Able Get In / Out Bed Need Assistance Housework Need Assistance Prepare Meals Not [place] Need Assistance Shop for Self Not Able Electronic Signature(s) Signed: 05/07/2020 4:03:25 PM By: Levan Hurst RN, BSN Entered By: Levan Hurst on 04/17/2020 15:19:26 -------------------------------------------------------------------------------- Education Screening Details Patient Name: Date of Service: [name], [name]. 04/17/2020 2:45 PM Medical Record Number: [id number] Patient Account Number: [id number] Date of Birth/Sex: Treating RN: Jun 17, 1945 (75 y.o. Female) Levan Hurst Primary Care Kamaiyah Uselton: Monico Blitz Other Clinician: Referring Jana Swartzlander: Treating Lamisha Roussell/Extender: Elveria Royals in Treatment: 0 Primary Learner Assessed: Patient Learning Preferences/Education Level/Primary Language Learning Preference: Explanation, Demonstration, Printed Material Highest Education Level: High School Preferred Language: English Cognitive Barrier Language Barrier: No Translator Needed: No Memory Deficit: No Emotional Barrier: No Cultural/Religious Beliefs Affecting Medical Care: No Physical Barrier Impaired Vision: No Impaired Hearing: No Decreased Hand dexterity: No Knowledge/Comprehension Knowledge Level: High Comprehension Level: High Ability to understand written instructions: High Ability to understand verbal instructions: High Motivation Anxiety Level: Calm Cooperation: Cooperative Education Importance: Acknowledges Need Interest in Health Problems: Asks Questions Perception: Coherent Willingness to Engage in Self-Management High Activities: Readiness to Engage in Self-Management High Activities: Electronic Signature(s) Signed: 05/07/2020 4:03:25 PM By: Levan Hurst RN, BSN Entered By: Levan Hurst on 04/17/2020 15:19:45 -------------------------------------------------------------------------------- Fall Risk Assessment Details Patient Name: Date of Service: [name], Mikiah M. 04/17/2020 2:45 PM Medical Record Number: [id number] Patient Account Number: [id number] Date of Birth/Sex: Treating RN: Apr 02, 1945 (75 y.o. Female) Levan Hurst Primary Care Tameca Jerez: Monico Blitz Other Clinician: Referring Aleph Nickson: Treating Kataya Guimont/Extender: Elveria Royals in Treatment: 0 Fall Risk Assessment Items Have you had 2 or more falls in the last 12 monthso 0 No Have you had any fall that resulted in injury in the last 12 monthso 0 No FALLS RISK SCREEN History of falling - immediate or within 3 months 25 Yes Secondary diagnosis (Do you have 2 or more medical diagnoseso) 15 Yes Ambulatory aid None/bed rest/wheelchair/nurse 0 Yes Crutches/cane/walker 0 No Furniture 0 No Intravenous therapy Access/Saline/Heparin Lock 0 No Gait/Transferring Normal/ bed rest/ wheelchair 0 Yes  Weak (short steps with or without shuffle, stooped but able to lift head while walking, may seek 0 No support from furniture) Impaired (short steps with shuffle, may have difficulty arising from chair, head down, impaired 0 No balance) Mental Status Oriented to own ability 0 Yes Electronic Signature(s) Signed: 05/07/2020 4:03:25 PM By: Levan Hurst RN, BSN Entered By: Levan Hurst on 04/17/2020 15:20:18 -------------------------------------------------------------------------------- Nutrition Risk Screening Details Patient Name: Date of Service: [name], [name]. 04/17/2020 2:45 PM Medical Record Number: [id number] Patient Account Number: [id number] Date of Birth/Sex: Treating RN: Oct 17, 1945 (75 y.o. Female) Levan Hurst Primary Care Nalini Alcaraz: Monico Blitz Other Clinician: Referring Katanya Schlie: Treating Anglia Blakley/Extender: Valerie Roys, Ashish Weeks in Treatment: 0 Height (in): 63 Weight (lbs): 147 Body Mass Index (BMI): 26 Nutrition Risk Screening Items Score Screening NUTRITION RISK SCREEN: I have an illness or condition that made me change the kind and/or amount of food I eat 2 Yes I eat fewer than two meals per day 0 No I eat few fruits and vegetables, or milk products 0 No I have three or more drinks of beer, liquor or wine almost every  day 0 No I have tooth or mouth problems that make it hard for me to eat 0 No I don't always have enough money to buy the food I need 0 No I eat alone most of the time 0 No I take three or more different prescribed or over-the-counter drugs a day 1 Yes Without wanting to, I have lost or gained 10 pounds in the last six months 0 No I am not always physically able to shop, cook and/or feed myself 2 Yes Nutrition Protocols Good Risk Protocol Moderate Risk Protocol 0 Provide education on nutrition High Risk Proctocol Risk Level: Moderate Risk Score: 5 Electronic Signature(s) Signed: 05/07/2020 4:03:25 PM By: Levan Hurst RN, BSN Entered By: Levan Hurst on 04/17/2020 15:20:26

## 2020-05-07 NOTE — Progress Notes (Signed)
JOCELINE, HINCHCLIFF ([id number]) Visit Report for 04/17/2020 Chief Complaint Document Details Patient Name: Date of Service: Ann RDAerika, Groll. 04/17/2020 2:45 PM Medical Record Number: [id number] Patient Account Number: [id number] Date of Birth/Sex: Treating RN: 1945-12-01 (75 y.o. Female) [name] Primary Care Provider: Monico Blitz Other Clinician: Referring Provider: Treating Provider/Extender: Elveria Royals in Treatment: 0 Information Obtained from: Patient Chief Complaint Sacral pressure ulcer Electronic Signature(s) Signed: 04/17/2020 3:54:12 PM By: Worthy Keeler PA-C Entered By: Worthy Keeler on 04/17/2020 15:54:12 -------------------------------------------------------------------------------- HPI Details Patient Name: Date of Service: Ann Ward, Ann M. 04/17/2020 2:45 PM Medical Record Number: [id number] Patient Account Number: [id number] Date of Birth/Sex: Treating RN: [birth year] (75 y.o. Female) [name] Primary Care Provider: Monico Blitz Other Clinician: Referring Provider: Treating Provider/Extender: Elveria Royals in Treatment: 0 History of Present Illness HPI Description: 04/17/2020 upon evaluation today patient presents for initial inspection here in our clinic concerning a wound that she has had in the sacral area since June 2021. She has been in a nursing facility for about 3 years. She does have dementia she is able to answer some questions but not everything is extremely quickly answered. She is seen with her daughter here in the office today. The patient does have a history of diabetes mellitus type 2, COPD, congestive heart failure, and she is dependent on supplemental oxygen daily. Currently her wound actually appears to be doing fairly well she does have again a sacral wound which has been bigger and deeper but still is very slow to heal she is previously undergoing treatment with a wound VAC although  that was severely irritating the skin surrounding. Currently she has been utilizing Dakin's moistened gauze which has been packed daily into the wound and I think this is actually doing quite well. Her daughter does inquire about the possibility of HBO therapy Electronic Signature(s) Signed: 04/17/2020 5:58:54 PM By: Worthy Keeler PA-C Entered By: Worthy Keeler on 04/17/2020 17:58:53 -------------------------------------------------------------------------------- Physical Exam Details Patient Name: Date of Service: Ann Ward, Ann M. 04/17/2020 2:45 PM Medical Record Number: [id number] Patient Account Number: [id number] Date of Birth/Sex: Treating RN: 1945/11/02 (75 y.o. Female) [name] Primary Care Provider: Other Clinician: Monico Blitz Referring Provider: Treating Provider/Extender: Valerie Roys, Ashish Weeks in Treatment: 0 Constitutional sitting or standing blood pressure is within target range for patient.. pulse regular and within target range for patient.[name] respirations regular, non-labored and within target range for patient.[name] temperature within target range for patient.. Well-nourished and well-hydrated in no acute distress. Eyes conjunctiva clear no eyelid edema noted. pupils equal round and reactive to light and accommodation. Ears, Nose, Mouth, and Throat no gross abnormality of ear auricles or external auditory canals. normal hearing noted during conversation. mucus membranes moist. Respiratory normal breathing without difficulty. Musculoskeletal Patient unable to walk without assistance. Psychiatric Patient is not able to cooperate in decision making regarding care. Patient is oriented to person and place. pleasant and cooperative. Notes Upon inspection patient's wound bed actually appears to be fairly clean. I do not see where she has had an x-ray of the area and I think this would be ideal with they can do this at the facility which would be my  suggestion currently. With that being said I do believe that she needs to have appropriate and continued offloading to keep pressure off of the area I think that is of utmost importance as well. I did not see any need for sharp  debridement today Electronic Signature(s) Signed: 04/17/2020 5:59:40 PM By: Worthy Keeler PA-C Entered By: Worthy Keeler on 04/17/2020 17:59:40 -------------------------------------------------------------------------------- Physician Orders Details Patient Name: Date of Service: Ann Ward, Ann Raring M. 04/17/2020 2:45 PM Medical Record Number: [id number] Patient Account Number: [id number] Date of Birth/Sex: Treating RN: [birth year] (75 y.o. Female) [name] Primary Care Provider: Monico Blitz Other Clinician: Referring Provider: Treating Provider/Extender: Elveria Royals in Treatment: 0 Verbal / Phone Orders: No Diagnosis Coding ICD-10 Coding Code Description L89.153 Pressure ulcer of sacral region, stage 3 E11.622 Type 2 diabetes mellitus with other skin ulcer F01.50 Vascular dementia without behavioral disturbance J44.9 Chronic obstructive pulmonary disease, unspecified I50.42 Chronic combined systolic (congestive) and diastolic (congestive) heart failure Z99.81 Dependence on supplemental oxygen Follow-up Appointments Return appointment in 3 weeks. Bathing/ Shower/ Hygiene May shower with protection but do not get wound dressing(s) wet. Off-Loading Low air-loss mattress (Group 2) - facility to obtain Turn and reposition every 2 hours Wound Treatment Wound #1 - Sacrum Prim Dressing: Dakin's .25% solution 2 x Per Day/30 Days ary Discharge Instructions: Pack wound with Dakin's moistened gauze Secondary Dressing: ComfortFoam Border, 6x6 in (silicone border) 2 x Per Day/30 Days Discharge Instructions: Apply over primary dressing or ABD secured with tape Radiology X-ray, coccyx - stage 3 pressure ulcer of sacrum, R/O osteomyelitis  to be done at facility. Please fax results to [phone] Electronic Signature(s) Signed: 04/17/2020 5:51:30 PM By: [name] RN, BSN Signed: 04/17/2020 6:05:28 PM By: Worthy Keeler PA-C Entered By: [name] on 04/17/2020 16:11:33 -------------------------------------------------------------------------------- Problem List Details Patient Name: Date of Service: Ann Ward, Ann Moats. 04/17/2020 2:45 PM Medical Record Number: [id number] Patient Account Number: [id number] Date of Birth/Sex: Treating RN: 1945-06-21 (75 y.o. Female) [name] Primary Care Provider: Monico Blitz Other Clinician: Referring Provider: Treating Provider/Extender: Valerie Roys, Ashish Weeks in Treatment: 0 Active Problems ICD-10 Encounter Code Description Active Date MDM Diagnosis L89.153 Pressure ulcer of sacral region, stage 3 04/17/2020 No Yes E11.622 Type 2 diabetes mellitus with other skin ulcer 04/17/2020 No Yes F01.50 Vascular dementia without behavioral disturbance 04/17/2020 No Yes J44.9 Chronic obstructive pulmonary disease, unspecified 04/17/2020 No Yes I50.42 Chronic combined systolic (congestive) and diastolic (congestive) heart failure 04/17/2020 No Yes Z99.81 Dependence on supplemental oxygen 04/17/2020 No Yes Inactive Problems Resolved Problems Electronic Signature(s) Signed: 04/17/2020 3:53:50 PM By: Worthy Keeler PA-C Entered By: Worthy Keeler on 04/17/2020 15:53:49 -------------------------------------------------------------------------------- Progress Note Details Patient Name: Date of Service: Ann Ward, Ann Moats. 04/17/2020 2:45 PM Medical Record Number: [id number] Patient Account Number: [id number] Date of Birth/Sex: Treating RN: 10-Aug-1945 (75 y.o. Female) [name] Primary Care Provider: Monico Blitz Other Clinician: Referring Provider: Treating Provider/Extender: Elveria Royals in Treatment: 0 Subjective Chief  Complaint Information obtained from Patient Sacral pressure ulcer History of Present Illness (HPI) 04/17/2020 upon evaluation today patient presents for initial inspection here in our clinic concerning a wound that she has had in the sacral area since June 2021. She has been in a nursing facility for about 3 years. She does have dementia she is able to answer some questions but not everything is extremely quickly answered. She is seen with her daughter here in the office today. The patient does have a history of diabetes mellitus type 2, COPD, congestive heart failure, and she is dependent on supplemental oxygen daily. Currently her wound actually appears to be doing fairly well she does have again a sacral wound which has been bigger and  deeper but still is very slow to heal she is previously undergoing treatment with a wound VAC although that was severely irritating the skin surrounding. Currently she has been utilizing Dakin's moistened gauze which has been packed daily into the wound and I think this is actually doing quite well. Her daughter does inquire about the possibility of HBO therapy Patient History Information obtained from Patient. Allergies No Known Drug Allergies Family History Cancer - Father, Diabetes - Mother, Heart Disease - Siblings, Hypertension - Child, No family history of Hereditary Spherocytosis, Kidney Disease, Lung Disease, Seizures, Stroke, Thyroid Problems, Tuberculosis. Social History Former smoker - quit 2006, Marital Status - Widowed, Alcohol Use - Never, Drug Use - No History, Caffeine Use - Rarely. Medical History Hematologic/Lymphatic Patient has history of Anemia Respiratory Patient has history of Chronic Obstructive Pulmonary Disease (COPD) Cardiovascular Patient has history of Congestive Heart Failure, Hypertension Gastrointestinal Patient has history of Cirrhosis - biliary Endocrine Patient has history of Type II Diabetes Neurologic Patient has  history of Dementia Oncologic Patient has history of Received Chemotherapy Patient is treated with Controlled Diet. Blood sugar is tested. Medical A Surgical History Notes nd Respiratory Oxygen dependent Gastrointestinal Incontinence Oncologic Breast cancer, s/p left mastectomy Review of Systems (ROS) Constitutional Symptoms (General Health) Denies complaints or symptoms of Fatigue, Fever, Chills, Marked Weight Change. Eyes Denies complaints or symptoms of Dry Eyes, Vision Changes, Glasses / Contacts. Ear/Nose/Mouth/Throat Denies complaints or symptoms of Chronic sinus problems or rhinitis. Gastrointestinal Denies complaints or symptoms of Frequent diarrhea, Nausea, Vomiting. Genitourinary Denies complaints or symptoms of Frequent urination. Integumentary (Skin) Complains or has symptoms of Wounds - sacral wound. Musculoskeletal Complains or has symptoms of Muscle Weakness. Neurologic Denies complaints or symptoms of Numbness/parasthesias. Psychiatric Denies complaints or symptoms of Claustrophobia, Suicidal. Objective Constitutional sitting or standing blood pressure is within target range for patient.. pulse regular and within target range for patient.[name] respirations regular, non-labored and within target range for patient.[name] temperature within target range for patient.. Well-nourished and well-hydrated in no acute distress. Vitals Time Taken: 3:06 PM, Height: 63 in, Source: Stated, Weight: 147 lbs, Source: Stated, BMI: 26, Temperature: 98.4 F, Pulse: 116 bpm, Respiratory Rate: 18 breaths/min, Blood Pressure: 116/73 mmHg. Eyes conjunctiva clear no eyelid edema noted. pupils equal round and reactive to light and accommodation. Ears, Nose, Mouth, and Throat no gross abnormality of ear auricles or external auditory canals. normal hearing noted during conversation. mucus membranes moist. Respiratory normal breathing without difficulty. Musculoskeletal Patient unable to walk  without assistance. Psychiatric Patient is not able to cooperate in decision making regarding care. Patient is oriented to person and place. pleasant and cooperative. General Notes: Upon inspection patient's wound bed actually appears to be fairly clean. I do not see where she has had an x-ray of the area and I think this would be ideal with they can do this at the facility which would be my suggestion currently. With that being said I do believe that she needs to have appropriate and continued offloading to keep pressure off of the area I think that is of utmost importance as well. I did not see any need for sharp debridement today Integumentary (Hair, Skin) Wound #1 status is Open. Original cause of wound was Pressure Injury. The wound is located on the Sacrum. The wound measures 5cm length x 4.3cm width x 1.3cm depth; 16.886cm^2 area and 21.952cm^3 volume. There is Fat Layer (Subcutaneous Tissue) exposed. There is no tunneling noted, however, there is undermining starting at 12:00 and ending  at 6:00 with a maximum distance of 2.2cm. There is a medium amount of serosanguineous drainage noted. The wound margin is flat and intact. There is medium (34-66%) pink granulation within the wound bed. There is a medium (34-66%) amount of necrotic tissue within the wound bed including Adherent Slough. Assessment Active Problems ICD-10 Pressure ulcer of sacral region, stage 3 Type 2 diabetes mellitus with other skin ulcer Vascular dementia without behavioral disturbance Chronic obstructive pulmonary disease, unspecified Chronic combined systolic (congestive) and diastolic (congestive) heart failure Dependence on supplemental oxygen Plan Follow-up Appointments: Return appointment in 3 weeks. Bathing/ Shower/ Hygiene: May shower with protection but do not get wound dressing(s) wet. Off-Loading: Low air-loss mattress (Group 2) - facility to obtain Turn and reposition every 2 hours Radiology ordered  were: X-ray, coccyx - stage 3 pressure ulcer of sacrum, R/O osteomyelitis to be done at facility. Please fax results to [phone] WOUND #1: - Sacrum Wound Laterality: Prim Dressing: Dakin's .25% solution 2 x Per Day/30 Days ary Discharge Instructions: Pack wound with Dakin's moistened gauze Secondary Dressing: ComfortFoam Border, 6x6 in (silicone border) 2 x Per Day/30 Days Discharge Instructions: Apply over primary dressing or ABD secured with tape 1. Would recommend currently that we have the patient go ahead and continue with the wound care measures as before and the patient is in agreement with that plan as is her daughter this is including the Dakin's moistened gauze packed into the wound bed 2 times a day which the facility has been doing up to this point without complication. I feel like this is actually a good way to go. 2. Also continuing to recommend offloading she should be repositioned at least every 2 hours. 3. I am also can recommend a low-air-loss mattress group 2 for the patient as long as the wound is present in order to try to keep pressure off of the area. 4. I am also going to suggest currently that the patient not sit up for too long at any point in time I think that is the most detrimental thing to this wound. 5. I am going to suggest an x-ray of the sacral/coccyx region to evaluate for any evidence of osteomyelitis hopeful that would not be the case. We will see patient back for reevaluation in 1 week here in the clinic. If anything worsens or changes patient will contact our office for additional recommendations. Electronic Signature(s) Signed: 04/17/2020 6:00:44 PM By: Worthy Keeler PA-C Entered By: Worthy Keeler on 04/17/2020 18:00:44 -------------------------------------------------------------------------------- HxROS Details Patient Name: Date of Service: Ann Ward, Ann M. 04/17/2020 2:45 PM Medical Record Number: [id number] Patient Account Number:  [id number] Date of Birth/Sex: Treating RN: 1945/08/26 (75 y.o. Female) Levan Hurst Primary Care Provider: Monico Blitz Other Clinician: Referring Provider: Treating Provider/Extender: Elveria Royals in Treatment: 0 Information Obtained From Patient Constitutional Symptoms (General Health) Complaints and Symptoms: Negative for: Fatigue; Fever; Chills; Marked Weight Change Eyes Complaints and Symptoms: Negative for: Dry Eyes; Vision Changes; Glasses / Contacts Ear/Nose/Mouth/Throat Complaints and Symptoms: Negative for: Chronic sinus problems or rhinitis Gastrointestinal Complaints and Symptoms: Negative for: Frequent diarrhea; Nausea; Vomiting Medical History: Positive for: Cirrhosis - biliary Past Medical History Notes: Incontinence Genitourinary Complaints and Symptoms: Negative for: Frequent urination Integumentary (Skin) Complaints and Symptoms: Positive for: Wounds - sacral wound Musculoskeletal Complaints and Symptoms: Positive for: Muscle Weakness Neurologic Complaints and Symptoms: Negative for: Numbness/parasthesias Medical History: Positive for: Dementia Psychiatric Complaints and Symptoms: Negative for: Claustrophobia; Suicidal Hematologic/Lymphatic Medical History: Positive for:  Anemia Respiratory Medical History: Positive for: Chronic Obstructive Pulmonary Disease (COPD) Past Medical History Notes: Oxygen dependent Cardiovascular Medical History: Positive for: Congestive Heart Failure; Hypertension Endocrine Medical History: Positive for: Type II Diabetes Time with diabetes: 15 years Treated with: Diet Blood sugar tested every day: Yes Tested : Immunological Oncologic Medical History: Positive for: Received Chemotherapy Past Medical History Notes: Breast cancer, s/p left mastectomy Immunizations Pneumococcal Vaccine: Received Pneumococcal Vaccination: Yes Implantable Devices None Family and Social  History Cancer: Yes - Father; Diabetes: Yes - Mother; Heart Disease: Yes - Siblings; Hereditary Spherocytosis: No; Hypertension: Yes - Child; Kidney Disease: No; Lung Disease: No; Seizures: No; Stroke: No; Thyroid Problems: No; Tuberculosis: No; Former smoker - quit 2006; Marital Status - Widowed; Alcohol Use: Never; Drug Use: No History; Caffeine Use: Rarely; Financial Concerns: No; Food, Clothing or Shelter Needs: No; Support System Lacking: No; Transportation Concerns: No Electronic Signature(s) Signed: 04/17/2020 6:05:28 PM By: Worthy Keeler PA-C Signed: 05/07/2020 4:03:25 PM By: Levan Hurst RN, BSN Entered By: Levan Hurst on 04/17/2020 15:33:24 -------------------------------------------------------------------------------- SuperBill Details Patient Name: Date of Service: Ann Ward, Ann M. 04/17/2020 Medical Record Number: [id number] Patient Account Number: [id number] Date of Birth/Sex: Treating RN: Dec 03, 1945 (75 y.o. Female) [name] Primary Care Provider: Monico Blitz Other Clinician: Referring Provider: Treating Provider/Extender: Elveria Royals in Treatment: 0 Diagnosis Coding ICD-10 Codes Code Description [phone] Pressure ulcer of sacral region, stage 3 E11.622 Type 2 diabetes mellitus with other skin ulcer F01.50 Vascular dementia without behavioral disturbance J44.9 Chronic obstructive pulmonary disease, unspecified I50.42 Chronic combined systolic (congestive) and diastolic (congestive) heart failure Z99.81 Dependence on supplemental oxygen Facility Procedures CPT4 Code: [id number] Description: 99214 - WOUND CARE VISIT-LEV 4 EST PT Modifier: Quantity: 1 Physician Procedures : CPT4 Code Description Modifier [id number] - WC PHYS LEVEL 4 - NEW PT ICD-10 Diagnosis Description L89.153 Pressure ulcer of sacral region, stage 3 E11.622 Type 2 diabetes mellitus with other skin ulcer F01.50 Vascular dementia without behavioral  disturbance  J44.9 Chronic obstructive pulmonary disease, unspecified Quantity: 1 Electronic Signature(s) Signed: 04/17/2020 6:01:08 PM By: Worthy Keeler PA-C Previous Signature: 04/17/2020 5:51:30 PM Version By: [name] RN, BSN Entered By: Worthy Keeler on 04/17/2020 18:01:08

## 2020-05-07 NOTE — Progress Notes (Signed)
Ann Ward, ROHNER ([id number]) Visit Report for 04/17/2020 Allergy List Details Patient Name: Date of Service: LIPFO RDHalee, Glynn. 04/17/2020 2:45 PM Medical Record Number: [id number] Patient Account Number: [id number] Date of Birth/Sex: Treating RN: 1945-09-06 (75 y.o. Female) Levan Hurst Primary Care Buryl Bamber: Monico Blitz Other Clinician: Referring Kalin Amrhein: Treating Onyx Edgley/Extender: Valerie Roys, Ashish Weeks in Treatment: 0 Allergies Active Allergies No Known Drug Allergies Type: Allergen Allergy Notes Electronic Signature(s) Signed: 05/07/2020 4:03:25 PM By: Levan Hurst RN, BSN Entered By: Levan Hurst on 04/17/2020 15:09:40 -------------------------------------------------------------------------------- Arrival Information Details Patient Name: Date of Service: [name], Ann Raring M. 04/17/2020 2:45 PM Medical Record Number: [id number] Patient Account Number: [id number] Date of Birth/Sex: Treating RN: February 13, 1946 (75 y.o. Female) Levan Hurst Primary Care Damoni Causby: Monico Blitz Other Clinician: Referring Laberta Wilbon: Treating Tylor Gambrill/Extender: Elveria Royals in Treatment: 0 Visit Information Patient Arrived: Wheel Chair Arrival Time: 15:03 Accompanied By: daughter Transfer Assistance: None Patient Identification Verified: Yes Secondary Verification Process Completed: Yes Patient Requires Transmission-Based Precautions: No Patient Has Alerts: No Electronic Signature(s) Signed: 05/07/2020 4:03:25 PM By: Levan Hurst RN, BSN Entered By: Levan Hurst on 04/17/2020 15:04:06 -------------------------------------------------------------------------------- Clinic Level of Care Assessment Details Patient Name: Date of Service: LIPFO Ann Ward. 04/17/2020 2:45 PM Medical Record Number: [id number] Patient Account Number: [id number] Date of Birth/Sex: Treating RN: 1946-03-07 (75 y.o. Female) [name] Primary Care Keian Odriscoll: Monico Blitz Other Clinician: Referring Siria Calandro: Treating Lars Jeziorski/Extender: Elveria Royals in Treatment: 0 Clinic Level of Care Assessment Items TOOL 2 Quantity Score []  - 0 Use when only an EandM is performed on the INITIAL visit ASSESSMENTS - Nursing Assessment / Reassessment X- 1 20 General Physical Exam (combine w/ comprehensive assessment (listed just below) when performed on new pt. evals) X- 1 25 Comprehensive Assessment (HX, ROS, Risk Assessments, Wounds Hx, etc.) ASSESSMENTS - Wound and Skin A ssessment / Reassessment X - Simple Wound Assessment / Reassessment - one wound 1 5 []  - 0 Complex Wound Assessment / Reassessment - multiple wounds []  - 0 Dermatologic / Skin Assessment (not related to wound area) ASSESSMENTS - Ostomy and/or Continence Assessment and Care []  - 0 Incontinence Assessment and Management []  - 0 Ostomy Care Assessment and Management (repouching, etc.) PROCESS - Coordination of Care X - Simple Patient / Family Education for ongoing care 1 15 []  - 0 Complex (extensive) Patient / Family Education for ongoing care X- 1 10 Staff obtains Programmer, systems, Records, T Results / Process Orders est X- 1 10 Staff telephones HHA, Nursing Homes / Clarify orders / etc []  - 0 Routine Transfer to another Facility (non-emergent condition) []  - 0 Routine Hospital Admission (non-emergent condition) X- 1 15 New Admissions / Biomedical engineer / Ordering NPWT Apligraf, etc. , []  - 0 Emergency Hospital Admission (emergent condition) X- 1 10 Simple Discharge Coordination []  - 0 Complex (extensive) Discharge Coordination PROCESS - Special Needs []  - 0 Pediatric / Minor Patient Management []  - 0 Isolation Patient Management []  - 0 Hearing / Language / Visual special needs []  - 0 Assessment of Community assistance (transportation, D/C planning, etc.) []  - 0 Additional assistance / Altered mentation []  - 0 Support Surface(s) Assessment (bed,  cushion, seat, etc.) INTERVENTIONS - Wound Cleansing / Measurement X- 1 5 Wound Imaging (photographs - any number of wounds) []  - 0 Wound Tracing (instead of photographs) X- 1 5 Simple Wound Measurement - one wound []  - 0 Complex Wound Measurement - multiple wounds X- 1 5 Simple Wound  Cleansing - one wound []  - 0 Complex Wound Cleansing - multiple wounds INTERVENTIONS - Wound Dressings X - Small Wound Dressing one or multiple wounds 1 10 []  - 0 Medium Wound Dressing one or multiple wounds []  - 0 Large Wound Dressing one or multiple wounds []  - 0 Application of Medications - injection INTERVENTIONS - Miscellaneous []  - 0 External ear exam []  - 0 Specimen Collection (cultures, biopsies, blood, body fluids, etc.) []  - 0 Specimen(s) / Culture(s) sent or taken to Lab for analysis []  - 0 Patient Transfer (multiple staff / Civil Service fast streamer / Similar devices) []  - 0 Simple Staple / Suture removal (25 or less) []  - 0 Complex Staple / Suture removal (26 or more) []  - 0 Hypo / Hyperglycemic Management (close monitor of Blood Glucose) []  - 0 Ankle / Brachial Index (ABI) - do not check if billed separately Has the patient been seen at the hospital within the last three years: Yes Total Score: 135 Level Of Care: New/Established - Level 4 Electronic Signature(s) Signed: 04/17/2020 5:51:30 PM By: [name] RN, BSN Entered By: [name] on 04/17/2020 16:07:32 -------------------------------------------------------------------------------- Encounter Discharge Information Details Patient Name: Date of Service: LIPFO Ann Ward. 04/17/2020 2:45 PM Medical Record Number: [id number] Patient Account Number: [id number] Date of Birth/Sex: Treating RN: [birth year] (75 y.o. Female) Deon Pilling Primary Care Clinten Howk: Monico Blitz Other Clinician: Referring Alichia Alridge: Treating Phinehas Grounds/Extender: Elveria Royals in Treatment: 0 Encounter Discharge Information  Items Discharge Condition: Stable Ambulatory Status: Wheelchair Discharge Destination: Skilled Nursing Facility Telephoned: No Orders Sent: Yes Transportation: Private Auto Accompanied By: daughter Schedule Follow-up Appointment: Yes Clinical Summary of Care: Electronic Signature(s) Signed: 04/17/2020 5:28:00 PM By: Deon Pilling Entered By: Deon Pilling on 04/17/2020 17:26:36 -------------------------------------------------------------------------------- [place] Details Patient Name: Date of Service: LIPFO Ann Ward. 04/17/2020 2:45 PM Medical Record Number: [id number] Patient Account Number: [id number] Date of Birth/Sex: Treating RN: July 22, 1945 (75 y.o. Female) [name] Primary Care Jourdyn Hasler: Monico Blitz Other Clinician: Referring Arafat Cocuzza: Treating Abigaelle Verley/Extender: Valerie Roys, Ashish Weeks in Treatment: 0 Active Inactive Abuse / Safety / Falls / Self Care Management Nursing Diagnoses: Potential for falls Goals: Patient/caregiver will verbalize/demonstrate measures taken to prevent injury and/or falls Date Initiated: 04/17/2020 Target Resolution Date: 05/15/2020 Goal Status: Active Interventions: Assess fall risk on admission and as needed Assess impairment of mobility on admission and as needed per policy Notes: Nutrition Nursing Diagnoses: Impaired glucose control: actual or potential Potential for alteratiion in Nutrition/Potential for imbalanced nutrition Goals: Patient/caregiver will maintain therapeutic glucose control Date Initiated: 04/17/2020 Target Resolution Date: 05/15/2020 Goal Status: Active Interventions: Provide education on elevated blood sugars and impact on wound healing Treatment Activities: Patient referred to Primary Care Physician for further nutritional evaluation : 04/17/2020 Notes: Pressure Nursing Diagnoses: Knowledge deficit related to management of pressures ulcers Potential for impaired tissue  integrity related to pressure, friction, moisture, and shear Goals: Patient/caregiver will verbalize understanding of pressure ulcer management Date Initiated: 04/17/2020 Target Resolution Date: 05/15/2020 Goal Status: Active Interventions: Assess: immobility, friction, shearing, incontinence upon admission and as needed Assess offloading mechanisms upon admission and as needed Provide education on pressure ulcers Notes: Wound/Skin Impairment Nursing Diagnoses: Impaired tissue integrity Knowledge deficit related to ulceration/compromised skin integrity Goals: Patient/caregiver will verbalize understanding of skin care regimen Date Initiated: 04/17/2020 Target Resolution Date: 05/15/2020 Goal Status: Active Ulcer/skin breakdown will have a volume reduction of 30% by week 4 Date Initiated: 04/17/2020 Target Resolution Date: 05/15/2020 Goal Status: Active Interventions: Assess  patient/caregiver ability to obtain necessary supplies Assess patient/caregiver ability to perform ulcer/skin care regimen upon admission and as needed Assess ulceration(s) every visit Provide education on ulcer and skin care Treatment Activities: Skin care regimen initiated : 04/17/2020 Topical wound management initiated : 04/17/2020 Notes: Electronic Signature(s) Signed: 04/17/2020 5:51:30 PM By: [name] RN, BSN Entered By: [name] on 04/17/2020 15:49:17 -------------------------------------------------------------------------------- Pain Assessment Details Patient Name: Date of Service: LIPFO Ann Ward. 04/17/2020 2:45 PM Medical Record Number: [id number] Patient Account Number: [id number] Date of Birth/Sex: Treating RN: 1946-02-01 (75 y.o. Female) Levan Hurst Primary Care Kaden Dunkel: Monico Blitz Other Clinician: Referring Fines Kimberlin: Treating Herschel Fleagle/Extender: Macario Golds Weeks in Treatment: 0 Active Problems Location of Pain Severity and Description of  Pain Patient Has Paino Yes Site Locations Pain Location: Pain in Ulcers With Dressing Change: Yes Duration of the Pain. Constant / Intermittento Intermittent Rate the pain. Current Pain Level: 4 Character of Pain Describe the Pain: Throbbing Pain Management and Medication Current Pain Management: Medication: Yes Cold Application: No Rest: No Massage: No Activity: No T.E.N.S.: No Heat Application: No Leg drop or elevation: No Is the Current Pain Management Adequate: Adequate How does your wound impact your activities of daily livingo Sleep: No Bathing: No Appetite: No Relationship With Others: No Bladder Continence: No Emotions: No Bowel Continence: No Work: No Toileting: No Drive: No Dressing: No Hobbies: No Engineer, maintenance) Signed: 05/07/2020 4:03:25 PM By: Levan Hurst RN, BSN Entered By: Levan Hurst on 04/17/2020 15:25:03 -------------------------------------------------------------------------------- Patient/Caregiver Education Details Patient Name: Date of Service: Ann Ward 1/19/2022andnbsp2:45 PM Medical Record Number: [id number] Patient Account Number: [id number] Date of Birth/Gender: Treating RN: 1945/09/06 (75 y.o. Female) [name] Primary Care Physician: Monico Blitz Other Clinician: Referring Physician: Treating Physician/Extender: Elveria Royals in Treatment: 0 Education Assessment Education Provided To: Patient Education Topics Provided Elevated Blood Sugar/ Impact on Healing: Pressure: Handouts: Pressure Ulcers: Care and Offloading, Pressure Ulcers: Care and Offloading 2, Preventing Pressure Ulcers Methods: Explain/Verbal, Printed Responses: Reinforcements needed, State content correctly [place]: o Handouts: Welcome T [facility] [place] o Methods: Explain/Verbal Responses: Reinforcements needed, State content correctly Electronic Signature(s) Signed: 04/17/2020  5:51:30 PM By: [name] RN, BSN Entered By: [name] on 04/17/2020 15:59:13 -------------------------------------------------------------------------------- Wound Assessment Details Patient Name: Date of Service: LIPFO Ann Ward. 04/17/2020 2:45 PM Medical Record Number: [id number] Patient Account Number: [id number] Date of Birth/Sex: Treating RN: [birth year] (75 y.o. Female) Levan Hurst Primary Care Milarose Savich: Monico Blitz Other Clinician: Referring Saidi Santacroce: Treating Danaye Sobh/Extender: Valerie Roys, Ashish Weeks in Treatment: 0 Wound Status Wound Number: 1 Primary Pressure Ulcer Etiology: Wound Location: Sacrum Wound Open Wounding Event: Pressure Injury Status: Date Acquired: 08/29/2019 Comorbid Anemia, Chronic Obstructive Pulmonary Disease (COPD), Weeks Of Treatment: 0 History: Hypertension, Type II Diabetes, Dementia, Received Clustered Wound: No Chemotherapy Photos Photo Uploaded By: Mikeal Hawthorne on 04/24/2020 11:12:35 Wound Measurements Length: (cm) 5 Width: (cm) 4.3 Depth: (cm) 1.3 Area: (cm) 16.886 Volume: (cm) 21.952 % Reduction in Area: 0% % Reduction in Volume: 0% Epithelialization: None Tunneling: No Undermining: Yes Starting Position (o'clock): 12 Ending Position (o'clock): 6 Maximum Distance: (cm) 2.2 Wound Description Classification: Category/Stage III Wound Margin: Flat and Intact Exudate Amount: Medium Exudate Type: Serosanguineous Exudate Color: red, brown Foul Odor After Cleansing: No Slough/Fibrino Yes Wound Bed Granulation Amount: Medium (34-66%) Exposed Structure Granulation Quality: Pink Fascia Exposed: No Necrotic Amount: Medium (34-66%) Fat Layer (Subcutaneous Tissue) Exposed: Yes Necrotic Quality: Adherent  Slough Tendon Exposed: No Muscle Exposed: No Joint Exposed: No Bone Exposed: No Treatment Notes Wound #1 (Sacrum) Cleanser Peri-Wound Care Topical Primary Dressing Dakin's .25%  solution Discharge Instruction: Pack wound with Dakin's moistened gauze Secondary Dressing ComfortFoam Border, 6x6 in (silicone border) Discharge Instruction: Apply over primary dressing or ABD secured with tape Secured With Compression Wrap Compression Stockings Add-Ons Electronic Signature(s) Signed: 05/07/2020 4:03:25 PM By: Levan Hurst RN, BSN Entered By: Levan Hurst on 04/17/2020 15:28:18 -------------------------------------------------------------------------------- [place] Details Patient Name: Date of Service: [name], Ann Raring M. 04/17/2020 2:45 PM Medical Record Number: [id number] Patient Account Number: [id number] Date of Birth/Sex: Treating RN: September 28, 1945 (75 y.o. Female) Levan Hurst Primary Care Twanisha Foulk: Monico Blitz Other Clinician: Referring Edwin Baines: Treating Obediah Welles/Extender: Valerie Roys, Ashish Weeks in Treatment: 0 Vital Signs Time Taken: 15:06 Temperature (F): 98.4 Height (in): 63 Pulse (bpm): 116 Source: Stated Respiratory Rate (breaths/min): 18 Weight (lbs): 147 Blood Pressure (mmHg): 116/73 Source: Stated Reference Range: 80 - 120 mg / dl Body Mass Index (BMI): 26 Electronic Signature(s) Signed: 05/07/2020 4:03:25 PM By: Levan Hurst RN, BSN Entered By: Levan Hurst on 04/17/2020 15:07:51

## 2020-05-08 ENCOUNTER — Encounter (HOSPITAL_BASED_OUTPATIENT_CLINIC_OR_DEPARTMENT_OTHER): Payer: Medicare Other | Admitting: Physician Assistant

## 2020-05-15 ENCOUNTER — Encounter (HOSPITAL_BASED_OUTPATIENT_CLINIC_OR_DEPARTMENT_OTHER): Payer: Medicare Other | Attending: Physician Assistant | Admitting: Physician Assistant

## 2020-05-15 ENCOUNTER — Other Ambulatory Visit: Payer: Self-pay

## 2020-05-15 DIAGNOSIS — L89153 Pressure ulcer of sacral region, stage 3: Secondary | ICD-10-CM | POA: Diagnosis not present

## 2020-05-15 DIAGNOSIS — J449 Chronic obstructive pulmonary disease, unspecified: Secondary | ICD-10-CM | POA: Insufficient documentation

## 2020-05-15 DIAGNOSIS — Z9981 Dependence on supplemental oxygen: Secondary | ICD-10-CM | POA: Insufficient documentation

## 2020-05-15 DIAGNOSIS — F0150 Vascular dementia without behavioral disturbance: Secondary | ICD-10-CM | POA: Insufficient documentation

## 2020-05-15 DIAGNOSIS — E11622 Type 2 diabetes mellitus with other skin ulcer: Secondary | ICD-10-CM | POA: Diagnosis not present

## 2020-05-15 DIAGNOSIS — I5042 Chronic combined systolic (congestive) and diastolic (congestive) heart failure: Secondary | ICD-10-CM | POA: Insufficient documentation

## 2020-05-15 NOTE — Progress Notes (Addendum)
Ann Ward, Ann Ward ([id number]) Visit Report for 05/15/2020 Chief Complaint Document Details Patient Name: Date of Service: LIPFO RDIfe, Vitelli. 05/15/2020 2:30 PM Medical Record Number: [id number] Patient Account Number: [id number] Date of Birth/Sex: Treating RN: [birth year] (75 y.o. Elam Dutch Primary Care Provider: Monico Blitz Other Clinician: Referring Provider: Treating Provider/Extender: Elveria Royals in Treatment: 4 Information Obtained from: Patient Chief Complaint Sacral pressure ulcer Electronic Signature(s) Signed: 05/15/2020 2:20:22 PM By: Worthy Keeler PA-C Entered By: Worthy Keeler on 05/15/2020 14:20:22 -------------------------------------------------------------------------------- HPI Details Patient Name: Date of Service: [name], Ann M. 05/15/2020 2:30 PM Medical Record Number: [id number] Patient Account Number: [id number] Date of Birth/Sex: Treating RN: 05-Aug-1945 (75 y.o. Elam Dutch Primary Care Provider: Monico Blitz Other Clinician: Referring Provider: Treating Provider/Extender: Elveria Royals in Treatment: 4 History of Present Illness HPI Description: 04/17/2020 upon evaluation today patient presents for initial inspection here in our clinic concerning a wound that she has had in the sacral area since June 2021. She has been in a nursing facility for about 3 years. She does have dementia she is able to answer some questions but not everything is extremely quickly answered. She is seen with her daughter here in the office today. The patient does have a history of diabetes mellitus type 2, COPD, congestive heart failure, and she is dependent on supplemental oxygen daily. Currently her wound actually appears to be doing fairly well she does have again a sacral wound which has been bigger and deeper but still is very slow to heal she is previously undergoing treatment with a wound VAC although that  was severely irritating the skin surrounding. Currently she has been utilizing Dakin's moistened gauze which has been packed daily into the wound and I think this is actually doing quite well. Her daughter does inquire about the possibility of HBO therapy 05/15/2020 upon evaluation today patient appears to be doing well with regard to her sacral wound in general. She has been tolerating the dressing changes without complication. Fortunately there is no signs of active infection at this time. No fevers, chills, nausea, vomiting, or diarrhea. Right now we are still unsure as to whether or not she got the x-ray that was ordered in the sacral region last time she was seen here 3 weeks ago. She also appears to have some pressure injury in the inferior sacral region due to likely sitting up too much on this. All this was discussed with the patient's daughter who was present during evaluation today. Electronic Signature(s) Signed: 05/15/2020 3:31:09 PM By: Worthy Keeler PA-C Entered By: Worthy Keeler on 05/15/2020 15:31:09 -------------------------------------------------------------------------------- Physical Exam Details Patient Name: Date of Service: [name], Ann M. 05/15/2020 2:30 PM Medical Record Number: [id number] Patient Account Number: [id number] Date of Birth/Sex: Treating RN: Jul 05, 1945 (75 y.o. Elam Dutch Primary Care Provider: Monico Blitz Other Clinician: Referring Provider: Treating Provider/Extender: Valerie Roys, Ashish Weeks in Treatment: 4 Constitutional Thin and well-hydrated in no acute distress. Respiratory normal breathing without difficulty. Psychiatric Patient is not able to cooperate in decision making regarding care. Patient has dementia. pleasant and cooperative. Notes Upon inspection patient's wound bed actually showed signs of good granulation and epithelization at this point. I do feel like she is making some progress in general. With that  being said this is still significantly large wound with quite a bit of depth and undermining. Nonetheless I do think the main issue is not really adhesive with  the depth of the wound but would rather the pressure to the inferior portion of the sacral area where she has some skin breakdown and deep tissue injury this is mild but nonetheless is related to pressure. I think she is sitting up much more than what we would like to see. I think this needs to be the main focus over the next 3 weeks of what to do for her to try to get this moving in the right direction. Electronic Signature(s) Signed: 05/15/2020 3:32:10 PM By: Worthy Keeler PA-C Entered By: Worthy Keeler on 05/15/2020 15:32:09 -------------------------------------------------------------------------------- Physician Orders Details Patient Name: Date of Service: [name], Ann Raring M. 05/15/2020 2:30 PM Medical Record Number: [id number] Patient Account Number: [id number] Date of Birth/Sex: Treating RN: March 28, 1946 (76 y.o. Elam Dutch Primary Care Provider: Monico Blitz Other Clinician: Referring Provider: Treating Provider/Extender: Elveria Royals in Treatment: 4 Verbal / Phone Orders: No Diagnosis Coding ICD-10 Coding Code Description L89.153 Pressure ulcer of sacral region, stage 3 E11.622 Type 2 diabetes mellitus with other skin ulcer F01.50 Vascular dementia without behavioral disturbance J44.9 Chronic obstructive pulmonary disease, unspecified I50.42 Chronic combined systolic (congestive) and diastolic (congestive) heart failure Z99.81 Dependence on supplemental oxygen Follow-up Appointments Return appointment in 3 weeks. Bathing/ Shower/ Hygiene May shower with protection but do not get wound dressing(s) wet. Off-Loading Low air-loss mattress (Group 2) - facility to obtain Turn and reposition every 2 hours Wound Treatment Wound #1 - Sacrum Prim Dressing: Dakin's .25% solution 2 x Per Day/30  Days ary Discharge Instructions: Pack wound with Dakin's moistened gauze Secondary Dressing: ComfortFoam Border, 6x6 in (silicone border) 2 x Per Day/30 Days Discharge Instructions: Apply over primary dressing or ABD secured with tape Electronic Signature(s) Signed: 05/15/2020 5:28:15 PM By: Worthy Keeler PA-C Signed: 05/15/2020 6:01:36 PM By: [name] RN, BSN Entered By: [name] on 05/15/2020 15:26:02 -------------------------------------------------------------------------------- Problem List Details Patient Name: Date of Service: [name], [name]. 05/15/2020 2:30 PM Medical Record Number: [id number] Patient Account Number: [id number] Date of Birth/Sex: Treating RN: 02/01/1946 (75 y.o. Elam Dutch Primary Care Provider: Monico Blitz Other Clinician: Referring Provider: Treating Provider/Extender: Elveria Royals in Treatment: 4 Active Problems ICD-10 Encounter Code Description Active Date MDM Diagnosis L89.153 Pressure ulcer of sacral region, stage 3 04/17/2020 No Yes E11.622 Type 2 diabetes mellitus with other skin ulcer 04/17/2020 No Yes F01.50 Vascular dementia without behavioral disturbance 04/17/2020 No Yes J44.9 Chronic obstructive pulmonary disease, unspecified 04/17/2020 No Yes I50.42 Chronic combined systolic (congestive) and diastolic (congestive) heart failure 04/17/2020 No Yes Z99.81 Dependence on supplemental oxygen 04/17/2020 No Yes Inactive Problems Resolved Problems Electronic Signature(s) Signed: 05/15/2020 2:20:16 PM By: Worthy Keeler PA-C Entered By: Worthy Keeler on 05/15/2020 14:20:16 -------------------------------------------------------------------------------- Progress Note Details Patient Name: Date of Service: [name], Ann Raring M. 05/15/2020 2:30 PM Medical Record Number: [id number] Patient Account Number: [id number] Date of Birth/Sex: Treating RN: [birth year] (75 y.o. Elam Dutch Primary Care  Provider: Monico Blitz Other Clinician: Referring Provider: Treating Provider/Extender: Elveria Royals in Treatment: 4 Subjective Chief Complaint Information obtained from Patient Sacral pressure ulcer History of Present Illness (HPI) 04/17/2020 upon evaluation today patient presents for initial inspection here in our clinic concerning a wound that she has had in the sacral area since June 2021. She has been in a nursing facility for about 3 years. She does have dementia she is able to answer some questions but not everything is extremely  quickly answered. She is seen with her daughter here in the office today. The patient does have a history of diabetes mellitus type 2, COPD, congestive heart failure, and she is dependent on supplemental oxygen daily. Currently her wound actually appears to be doing fairly well she does have again a sacral wound which has been bigger and deeper but still is very slow to heal she is previously undergoing treatment with a wound VAC although that was severely irritating the skin surrounding. Currently she has been utilizing Dakin's moistened gauze which has been packed daily into the wound and I think this is actually doing quite well. Her daughter does inquire about the possibility of HBO therapy 05/15/2020 upon evaluation today patient appears to be doing well with regard to her sacral wound in general. She has been tolerating the dressing changes without complication. Fortunately there is no signs of active infection at this time. No fevers, chills, nausea, vomiting, or diarrhea. Right now we are still unsure as to whether or not she got the x-ray that was ordered in the sacral region last time she was seen here 3 weeks ago. She also appears to have some pressure injury in the inferior sacral region due to likely sitting up too much on this. All this was discussed with the patient's daughter who was present during evaluation  today. Objective Constitutional Thin and well-hydrated in no acute distress. Vitals Time Taken: 2:55 PM, Height: 63 in, Weight: 147 lbs, BMI: 26, Temperature: 97.8 F, Pulse: 118 bpm, Respiratory Rate: 18 breaths/min, Blood Pressure: 111/68 mmHg. Respiratory normal breathing without difficulty. Psychiatric Patient is not able to cooperate in decision making regarding care. Patient has dementia. pleasant and cooperative. General Notes: Upon inspection patient's wound bed actually showed signs of good granulation and epithelization at this point. I do feel like she is making some progress in general. With that being said this is still significantly large wound with quite a bit of depth and undermining. Nonetheless I do think the main issue is not really adhesive with the depth of the wound but would rather the pressure to the inferior portion of the sacral area where she has some skin breakdown and deep tissue injury this is mild but nonetheless is related to pressure. I think she is sitting up much more than what we would like to see. I think this needs to be the main focus over the next 3 weeks of what to do for her to try to get this moving in the right direction. Integumentary (Hair, Skin) Wound #1 status is Open. Original cause of wound was Pressure Injury. The wound is located on the Sacrum. The wound measures 5.4cm length x 4cm width x 2cm depth; 16.965cm^2 area and 33.929cm^3 volume. There is Fat Layer (Subcutaneous Tissue) exposed. There is no tunneling noted, however, there is undermining starting at 7:00 and ending at 4:00 with a maximum distance of 2.4cm. There is a medium amount of serosanguineous drainage noted. The wound margin is flat and intact. There is large (67-100%) pink granulation within the wound bed. There is a small (1-33%) amount of necrotic tissue within the wound bed including Adherent Slough. Assessment Active Problems ICD-10 Pressure ulcer of sacral region, stage  3 Type 2 diabetes mellitus with other skin ulcer Vascular dementia without behavioral disturbance Chronic obstructive pulmonary disease, unspecified Chronic combined systolic (congestive) and diastolic (congestive) heart failure Dependence on supplemental oxygen Plan Follow-up Appointments: Return appointment in 3 weeks. Bathing/ Shower/ Hygiene: May shower with protection but do  not get wound dressing(s) wet. Off-Loading: Low air-loss mattress (Group 2) - facility to obtain Turn and reposition every 2 hours WOUND #1: - Sacrum Wound Laterality: Prim Dressing: Dakin's .25% solution 2 x Per Day/30 Days ary Discharge Instructions: Pack wound with Dakin's moistened gauze Secondary Dressing: ComfortFoam Border, 6x6 in (silicone border) 2 x Per Day/30 Days Discharge Instructions: Apply over primary dressing or ABD secured with tape 1. Would recommend currently that we going continue with the wound care measures as before and the patient is in agreement with the plan this includes the use of Dakin's moistened gauze packed into the wound bed. 2. I am also can recommend that we continue with a border foam dressing to cover. 3. I am also can recommend the patient needs to be appropriately offloaded I think there is been way too much pressure occurring to this location currently and that is causing some issues here as well. I did reiterate to the patient's daughter that this needs to be when the main focus that she has with the facility on the care of her mother. We will see patient back for reevaluation in 3 weeks here in the clinic. If anything worsens or changes patient will contact our office for additional recommendations. Electronic Signature(s) Signed: 05/15/2020 3:33:15 PM By: Worthy Keeler PA-C Entered By: Worthy Keeler on 05/15/2020 15:33:15 -------------------------------------------------------------------------------- SuperBill Details Patient Name: Date of Service: [name],  Maryl M. 05/15/2020 Medical Record Number: [id number] Patient Account Number: [id number] Date of Birth/Sex: Treating RN: 06-Nov-1945 (75 y.o. Martyn Malay, Linda Primary Care Provider: Monico Blitz Other Clinician: Referring Provider: Treating Provider/Extender: Elveria Royals in Treatment: 4 Diagnosis Coding ICD-10 Codes Code Description [phone] Pressure ulcer of sacral region, stage 3 E11.622 Type 2 diabetes mellitus with other skin ulcer F01.50 Vascular dementia without behavioral disturbance J44.9 Chronic obstructive pulmonary disease, unspecified I50.42 Chronic combined systolic (congestive) and diastolic (congestive) heart failure Z99.81 Dependence on supplemental oxygen Facility Procedures CPT4 Code: [id number] Description: 99213 - WOUND CARE VISIT-LEV 3 EST PT Modifier: Quantity: 1 Physician Procedures : CPT4 Code Description Modifier [id number] - WC PHYS LEVEL 4 - EST PT ICD-10 Diagnosis Description L89.153 Pressure ulcer of sacral region, stage 3 E11.622 Type 2 diabetes mellitus with other skin ulcer F01.50 Vascular dementia without behavioral  disturbance J44.9 Chronic obstructive pulmonary disease, unspecified Quantity: 1 Electronic Signature(s) Signed: 05/15/2020 3:33:56 PM By: Worthy Keeler PA-C Previous Signature: 05/15/2020 3:33:27 PM Version By: Worthy Keeler PA-C Entered By: Worthy Keeler on 05/15/2020 15:33:56

## 2020-05-17 NOTE — Progress Notes (Signed)
TORA, PRUNTY ([id number]) Visit Report for 05/15/2020 Arrival Information Details Patient Name: Date of Service: Ann Ward, Ann Ward 05/15/2020 2:30 PM Medical Record Number: [id number] Patient Account Number: [id number] Date of Birth/Sex: Treating RN: Jan 23, 1946 (75 y.o. Ann Ward Primary Care Lynton Crescenzo: Monico Blitz Other Clinician: Referring Shaul Trautman: Treating Zyona Pettaway/Extender: Elveria Royals in Treatment: 4 Visit Information History Since Last Visit Added or deleted any medications: No Patient Arrived: Wheel Chair Any new allergies or adverse reactions: No Arrival Time: 14:55 Had a fall or experienced change in No Accompanied By: daughter activities of daily living that may affect Transfer Assistance: Manual risk of falls: Patient Identification Verified: Yes Signs or symptoms of abuse/neglect since last visito No Secondary Verification Process Completed: Yes Hospitalized since last visit: No Patient Requires Transmission-Based Precautions: No Implantable device outside of the clinic excluding No Patient Has Alerts: No cellular tissue based products placed in the center since last visit: Has Dressing in Place as Prescribed: Yes Pain Present Now: No Electronic Signature(s) Signed: 05/17/2020 4:25:56 PM By: Levan Hurst RN, BSN Entered By: Levan Hurst on 05/15/2020 14:55:55 -------------------------------------------------------------------------------- Clinic Level of Care Assessment Details Patient Name: Date of Service: [name], [name] 05/15/2020 2:30 PM Medical Record Number: [id number] Patient Account Number: [id number] Date of Birth/Sex: Treating RN: [birth year] (74 y.o. Ann Ward Primary Care Jere Bostrom: Monico Blitz Other Clinician: Referring Bayden Gil: Treating Chandelle Harkey/Extender: Elveria Royals in Treatment: 4 Clinic Level of Care Assessment Items TOOL 4 Quantity Score []  - 0 Use when only an  EandM is performed on FOLLOW-UP visit ASSESSMENTS - Nursing Assessment / Reassessment X- 1 10 Reassessment of Co-morbidities (includes updates in patient status) X- 1 5 Reassessment of Adherence to Treatment Plan ASSESSMENTS - Wound and Skin A ssessment / Reassessment X - Simple Wound Assessment / Reassessment - one wound 1 5 []  - 0 Complex Wound Assessment / Reassessment - multiple wounds []  - 0 Dermatologic / Skin Assessment (not related to wound area) ASSESSMENTS - Focused Assessment []  - 0 Circumferential Edema Measurements - multi extremities []  - 0 Nutritional Assessment / Counseling / Intervention []  - 0 Lower Extremity Assessment (monofilament, tuning fork, pulses) []  - 0 Peripheral Arterial Disease Assessment (using hand held doppler) ASSESSMENTS - Ostomy and/or Continence Assessment and Care []  - 0 Incontinence Assessment and Management []  - 0 Ostomy Care Assessment and Management (repouching, etc.) PROCESS - Coordination of Care X - Simple Patient / Family Education for ongoing care 1 15 []  - 0 Complex (extensive) Patient / Family Education for ongoing care X- 1 10 Staff obtains Consents, Records, T Results / Process Orders est X- 1 10 Staff telephones HHA, Nursing Homes / Clarify orders / etc []  - 0 Routine Transfer to another Facility (non-emergent condition) []  - 0 Routine Hospital Admission (non-emergent condition) []  - 0 New Admissions / Biomedical engineer / Ordering NPWT Apligraf, etc. , []  - 0 Emergency Hospital Admission (emergent condition) X- 1 10 Simple Discharge Coordination []  - 0 Complex (extensive) Discharge Coordination PROCESS - Special Needs []  - 0 Pediatric / Minor Patient Management []  - 0 Isolation Patient Management []  - 0 Hearing / Language / Visual special needs []  - 0 Assessment of Community assistance (transportation, D/C planning, etc.) []  - 0 Additional assistance / Altered mentation []  - 0 Support Surface(s)  Assessment (bed, cushion, seat, etc.) INTERVENTIONS - Wound Cleansing / Measurement X - Simple Wound Cleansing - one wound 1 5 []  - 0 Complex Wound Cleansing -  multiple wounds X- 1 5 Wound Imaging (photographs - any number of wounds) []  - 0 Wound Tracing (instead of photographs) X- 1 5 Simple Wound Measurement - one wound []  - 0 Complex Wound Measurement - multiple wounds INTERVENTIONS - Wound Dressings X - Small Wound Dressing one or multiple wounds 1 10 []  - 0 Medium Wound Dressing one or multiple wounds []  - 0 Large Wound Dressing one or multiple wounds X- 1 5 Application of Medications - topical []  - 0 Application of Medications - injection INTERVENTIONS - Miscellaneous []  - 0 External ear exam []  - 0 Specimen Collection (cultures, biopsies, blood, body fluids, etc.) []  - 0 Specimen(s) / Culture(s) sent or taken to Lab for analysis []  - 0 Patient Transfer (multiple staff / Civil Service fast streamer / Similar devices) []  - 0 Simple Staple / Suture removal (25 or less) []  - 0 Complex Staple / Suture removal (26 or more) []  - 0 Hypo / Hyperglycemic Management (close monitor of Blood Glucose) []  - 0 Ankle / Brachial Index (ABI) - do not check if billed separately X- 1 5 Vital Signs Has the patient been seen at the hospital within the last three years: Yes Total Score: 100 Level Of Care: New/Established - Level 3 Electronic Signature(s) Signed: 05/15/2020 6:01:36 PM By: [name] RN, BSN Entered By: [name] on 05/15/2020 15:23:53 -------------------------------------------------------------------------------- Multi-Disciplinary Care Plan Details Patient Name: Date of Service: [name], Ann Ward. 05/15/2020 2:30 PM Medical Record Number: [id number] Patient Account Number: [id number] Date of Birth/Sex: Treating RN: Sep 21, 1945 (75 y.o. Ann Ward Primary Care Earnest Thalman: Monico Blitz Other Clinician: Referring Aiko Belko: Treating Emersen Mascari/Extender: Elveria Royals in Treatment: 4 Active Inactive Abuse / Safety / Falls / Self Care Management Nursing Diagnoses: Potential for falls Goals: Patient/caregiver will verbalize/demonstrate measures taken to prevent injury and/or falls Date Initiated: 04/17/2020 Target Resolution Date: 06/12/2020 Goal Status: Active Interventions: Assess fall risk on admission and as needed Assess impairment of mobility on admission and as needed per policy Notes: Nutrition Nursing Diagnoses: Impaired glucose control: actual or potential Potential for alteratiion in Nutrition/Potential for imbalanced nutrition Goals: Patient/caregiver will maintain therapeutic glucose control Date Initiated: 04/17/2020 Target Resolution Date: 06/12/2020 Goal Status: Active Interventions: Provide education on elevated blood sugars and impact on wound healing Treatment Activities: Patient referred to Primary Care Physician for further nutritional evaluation : 04/17/2020 Notes: Pressure Nursing Diagnoses: Knowledge deficit related to management of pressures ulcers Potential for impaired tissue integrity related to pressure, friction, moisture, and shear Goals: Patient/caregiver will verbalize understanding of pressure ulcer management Date Initiated: 04/17/2020 Target Resolution Date: 06/12/2020 Goal Status: Active Interventions: Assess: immobility, friction, shearing, incontinence upon admission and as needed Assess offloading mechanisms upon admission and as needed Provide education on pressure ulcers Notes: Wound/Skin Impairment Nursing Diagnoses: Impaired tissue integrity Knowledge deficit related to ulceration/compromised skin integrity Goals: Patient/caregiver will verbalize understanding of skin care regimen Date Initiated: 04/17/2020 Target Resolution Date: 06/12/2020 Goal Status: Active Ulcer/skin breakdown will have a volume reduction of 30% by week 4 Date Initiated: 04/17/2020 Date Inactivated:  05/15/2020 Target Resolution Date: 05/15/2020 Unmet Reason: chronic, poor Goal Status: Unmet prognosis Interventions: Assess patient/caregiver ability to obtain necessary supplies Assess patient/caregiver ability to perform ulcer/skin care regimen upon admission and as needed Assess ulceration(s) every visit Provide education on ulcer and skin care Treatment Activities: Skin care regimen initiated : 04/17/2020 Topical wound management initiated : 04/17/2020 Notes: Electronic Signature(s) Signed: 05/15/2020 6:01:36 PM By: [name] RN, BSN Entered By: Johna Roles,  Linda on 05/15/2020 15:08:09 -------------------------------------------------------------------------------- Pain Assessment Details Patient Name: Date of Service: LIPFO RDJeilyn, Reznik. 05/15/2020 2:30 PM Medical Record Number: [id number] Patient Account Number: [id number] Date of Birth/Sex: Treating RN: 1945-12-26 (75 y.o. Ann Ward Primary Care Alyissa Whidbee: Monico Blitz Other Clinician: Referring Anyia Gierke: Treating Adden Strout/Extender: Elveria Royals in Treatment: 4 Active Problems Location of Pain Severity and Description of Pain Patient Has Paino No Site Locations Pain Management and Medication Current Pain Management: Electronic Signature(s) Signed: 05/17/2020 4:25:56 PM By: Levan Hurst RN, BSN Entered By: Levan Hurst on 05/15/2020 14:56:14 -------------------------------------------------------------------------------- Patient/Caregiver Education Details Patient Name: Date of Service: Sheria Lang 2/16/2022andnbsp2:30 PM Medical Record Number: [id number] Patient Account Number: [id number] Date of Birth/Gender: Treating RN: 1945/09/30 (75 y.o. Ann Ward Primary Care Physician: Monico Blitz Other Clinician: Referring Physician: Treating Physician/Extender: Elveria Royals in Treatment: 4 Education Assessment Education Provided  To: Patient Education Topics Provided Pressure: Methods: Explain/Verbal Responses: Reinforcements needed, State content correctly Wound/Skin Impairment: Methods: Explain/Verbal Responses: Reinforcements needed, State content correctly Electronic Signature(s) Signed: 05/15/2020 6:01:36 PM By: [name] RN, BSN Entered By: [name] on 05/15/2020 15:08:37 -------------------------------------------------------------------------------- Wound Assessment Details Patient Name: Date of Service: [name], Ann Ward. 05/15/2020 2:30 PM Medical Record Number: [id number] Patient Account Number: [id number] Date of Birth/Sex: Treating RN: [birth year] (75 y.o. Ann Ward Primary Care Nadim Malia: Monico Blitz Other Clinician: Referring Tyden Kann: Treating Odell Fasching/Extender: Valerie Roys, Ashish Weeks in Treatment: 4 Wound Status Wound Number: 1 Primary Pressure Ulcer Etiology: Wound Location: Sacrum Wound Open Wounding Event: Pressure Injury Status: Date Acquired: 08/29/2019 Comorbid Anemia, Chronic Obstructive Pulmonary Disease (COPD), Weeks Of Treatment: 4 History: Congestive Heart Failure, Hypertension, Cirrhosis , Type II Clustered Wound: No Diabetes, Dementia, Received Chemotherapy Photos Photo Uploaded By: Mikeal Hawthorne on 05/16/2020 11:28:48 Wound Measurements Length: (cm) 5.4 Width: (cm) 4 Depth: (cm) 2 Area: (cm) 16.965 Volume: (cm) 33.929 % Reduction in Area: -0.5% % Reduction in Volume: -54.6% Epithelialization: None Tunneling: No Undermining: Yes Starting Position (o'clock): 7 Ending Position (o'clock): 4 Maximum Distance: (cm) 2.4 Wound Description Classification: Category/Stage III Wound Margin: Flat and Intact Exudate Amount: Medium Exudate Type: Serosanguineous Exudate Color: red, brown Foul Odor After Cleansing: No Slough/Fibrino Yes Wound Bed Granulation Amount: Large (67-100%) Exposed Structure Granulation Quality: Pink Fascia  Exposed: No Necrotic Amount: Small (1-33%) Fat Layer (Subcutaneous Tissue) Exposed: Yes Necrotic Quality: Adherent Slough Tendon Exposed: No Muscle Exposed: No Joint Exposed: No Bone Exposed: No Electronic Signature(s) Signed: 05/17/2020 4:25:56 PM By: Levan Hurst RN, BSN Entered By: Levan Hurst on 05/15/2020 14:56:43 -------------------------------------------------------------------------------- [place] Details Patient Name: Date of Service: [name], [name] 05/15/2020 2:30 PM Medical Record Number: [id number] Patient Account Number: [id number] Date of Birth/Sex: Treating RN: 03/04/1946 (75 y.o. Ann Ward Primary Care Milley Vining: Monico Blitz Other Clinician: Referring Liliah Dorian: Treating Annleigh Knueppel/Extender: Elveria Royals in Treatment: 4 Vital Signs Time Taken: 14:55 Temperature (F): 97.8 Height (in): 63 Pulse (bpm): 118 Weight (lbs): 147 Respiratory Rate (breaths/min): 18 Body Mass Index (BMI): 26 Blood Pressure (mmHg): 111/68 Reference Range: 80 - 120 mg / dl Electronic Signature(s) Signed: 05/17/2020 4:25:56 PM By: Levan Hurst RN, BSN Entered By: Levan Hurst on 05/15/2020 14:56:10

## 2020-05-22 DIAGNOSIS — L89153 Pressure ulcer of sacral region, stage 3: Secondary | ICD-10-CM | POA: Diagnosis not present

## 2020-05-22 DIAGNOSIS — M6281 Muscle weakness (generalized): Secondary | ICD-10-CM | POA: Diagnosis not present

## 2020-05-22 DIAGNOSIS — R2689 Other abnormalities of gait and mobility: Secondary | ICD-10-CM | POA: Diagnosis not present

## 2020-05-23 DIAGNOSIS — M6281 Muscle weakness (generalized): Secondary | ICD-10-CM | POA: Diagnosis not present

## 2020-05-23 DIAGNOSIS — R2689 Other abnormalities of gait and mobility: Secondary | ICD-10-CM | POA: Diagnosis not present

## 2020-05-23 DIAGNOSIS — L89153 Pressure ulcer of sacral region, stage 3: Secondary | ICD-10-CM | POA: Diagnosis not present

## 2020-05-24 DIAGNOSIS — R2689 Other abnormalities of gait and mobility: Secondary | ICD-10-CM | POA: Diagnosis not present

## 2020-05-24 DIAGNOSIS — M6281 Muscle weakness (generalized): Secondary | ICD-10-CM | POA: Diagnosis not present

## 2020-05-24 DIAGNOSIS — L89153 Pressure ulcer of sacral region, stage 3: Secondary | ICD-10-CM | POA: Diagnosis not present

## 2020-05-27 DIAGNOSIS — L89153 Pressure ulcer of sacral region, stage 3: Secondary | ICD-10-CM | POA: Diagnosis not present

## 2020-05-27 DIAGNOSIS — R2689 Other abnormalities of gait and mobility: Secondary | ICD-10-CM | POA: Diagnosis not present

## 2020-05-27 DIAGNOSIS — M6281 Muscle weakness (generalized): Secondary | ICD-10-CM | POA: Diagnosis not present

## 2020-05-28 DIAGNOSIS — M6281 Muscle weakness (generalized): Secondary | ICD-10-CM | POA: Diagnosis not present

## 2020-05-28 DIAGNOSIS — R2689 Other abnormalities of gait and mobility: Secondary | ICD-10-CM | POA: Diagnosis not present

## 2020-05-28 DIAGNOSIS — L89153 Pressure ulcer of sacral region, stage 3: Secondary | ICD-10-CM | POA: Diagnosis not present

## 2020-05-29 DIAGNOSIS — M6281 Muscle weakness (generalized): Secondary | ICD-10-CM | POA: Diagnosis not present

## 2020-05-29 DIAGNOSIS — L89153 Pressure ulcer of sacral region, stage 3: Secondary | ICD-10-CM | POA: Diagnosis not present

## 2020-05-29 DIAGNOSIS — R2689 Other abnormalities of gait and mobility: Secondary | ICD-10-CM | POA: Diagnosis not present

## 2020-05-30 DIAGNOSIS — M6281 Muscle weakness (generalized): Secondary | ICD-10-CM | POA: Diagnosis not present

## 2020-05-30 DIAGNOSIS — L89153 Pressure ulcer of sacral region, stage 3: Secondary | ICD-10-CM | POA: Diagnosis not present

## 2020-05-30 DIAGNOSIS — R2689 Other abnormalities of gait and mobility: Secondary | ICD-10-CM | POA: Diagnosis not present

## 2020-05-31 DIAGNOSIS — L89153 Pressure ulcer of sacral region, stage 3: Secondary | ICD-10-CM | POA: Diagnosis not present

## 2020-05-31 DIAGNOSIS — R2689 Other abnormalities of gait and mobility: Secondary | ICD-10-CM | POA: Diagnosis not present

## 2020-05-31 DIAGNOSIS — M6281 Muscle weakness (generalized): Secondary | ICD-10-CM | POA: Diagnosis not present

## 2020-06-03 DIAGNOSIS — M6281 Muscle weakness (generalized): Secondary | ICD-10-CM | POA: Diagnosis not present

## 2020-06-03 DIAGNOSIS — L89153 Pressure ulcer of sacral region, stage 3: Secondary | ICD-10-CM | POA: Diagnosis not present

## 2020-06-03 DIAGNOSIS — R2689 Other abnormalities of gait and mobility: Secondary | ICD-10-CM | POA: Diagnosis not present

## 2020-06-04 DIAGNOSIS — M6281 Muscle weakness (generalized): Secondary | ICD-10-CM | POA: Diagnosis not present

## 2020-06-04 DIAGNOSIS — L89153 Pressure ulcer of sacral region, stage 3: Secondary | ICD-10-CM | POA: Diagnosis not present

## 2020-06-04 DIAGNOSIS — I10 Essential (primary) hypertension: Secondary | ICD-10-CM | POA: Diagnosis not present

## 2020-06-04 DIAGNOSIS — I5032 Chronic diastolic (congestive) heart failure: Secondary | ICD-10-CM | POA: Diagnosis not present

## 2020-06-04 DIAGNOSIS — R2689 Other abnormalities of gait and mobility: Secondary | ICD-10-CM | POA: Diagnosis not present

## 2020-06-04 DIAGNOSIS — E1165 Type 2 diabetes mellitus with hyperglycemia: Secondary | ICD-10-CM | POA: Diagnosis not present

## 2020-06-04 DIAGNOSIS — Z299 Encounter for prophylactic measures, unspecified: Secondary | ICD-10-CM | POA: Diagnosis not present

## 2020-06-05 ENCOUNTER — Encounter (HOSPITAL_BASED_OUTPATIENT_CLINIC_OR_DEPARTMENT_OTHER): Payer: Medicare Other | Attending: Physician Assistant | Admitting: Physician Assistant

## 2020-06-05 DIAGNOSIS — R2689 Other abnormalities of gait and mobility: Secondary | ICD-10-CM | POA: Diagnosis not present

## 2020-06-05 DIAGNOSIS — L89153 Pressure ulcer of sacral region, stage 3: Secondary | ICD-10-CM | POA: Diagnosis not present

## 2020-06-05 DIAGNOSIS — M6281 Muscle weakness (generalized): Secondary | ICD-10-CM | POA: Diagnosis not present

## 2020-06-06 DIAGNOSIS — R2689 Other abnormalities of gait and mobility: Secondary | ICD-10-CM | POA: Diagnosis not present

## 2020-06-06 DIAGNOSIS — L89153 Pressure ulcer of sacral region, stage 3: Secondary | ICD-10-CM | POA: Diagnosis not present

## 2020-06-06 DIAGNOSIS — M6281 Muscle weakness (generalized): Secondary | ICD-10-CM | POA: Diagnosis not present

## 2020-06-07 DIAGNOSIS — E1142 Type 2 diabetes mellitus with diabetic polyneuropathy: Secondary | ICD-10-CM | POA: Diagnosis not present

## 2020-06-07 DIAGNOSIS — S7291XA Unspecified fracture of right femur, initial encounter for closed fracture: Secondary | ICD-10-CM | POA: Diagnosis not present

## 2020-06-07 DIAGNOSIS — E785 Hyperlipidemia, unspecified: Secondary | ICD-10-CM | POA: Diagnosis not present

## 2020-06-07 DIAGNOSIS — Z466 Encounter for fitting and adjustment of urinary device: Secondary | ICD-10-CM | POA: Diagnosis not present

## 2020-06-07 DIAGNOSIS — Z87891 Personal history of nicotine dependence: Secondary | ICD-10-CM | POA: Diagnosis not present

## 2020-06-07 DIAGNOSIS — I10 Essential (primary) hypertension: Secondary | ICD-10-CM | POA: Diagnosis not present

## 2020-06-07 DIAGNOSIS — Z743 Need for continuous supervision: Secondary | ICD-10-CM | POA: Diagnosis not present

## 2020-06-07 DIAGNOSIS — B9689 Other specified bacterial agents as the cause of diseases classified elsewhere: Secondary | ICD-10-CM | POA: Diagnosis not present

## 2020-06-07 DIAGNOSIS — E1151 Type 2 diabetes mellitus with diabetic peripheral angiopathy without gangrene: Secondary | ICD-10-CM | POA: Diagnosis not present

## 2020-06-07 DIAGNOSIS — S72301A Unspecified fracture of shaft of right femur, initial encounter for closed fracture: Secondary | ICD-10-CM | POA: Diagnosis not present

## 2020-06-07 DIAGNOSIS — M6281 Muscle weakness (generalized): Secondary | ICD-10-CM | POA: Diagnosis not present

## 2020-06-07 DIAGNOSIS — S72331A Displaced oblique fracture of shaft of right femur, initial encounter for closed fracture: Secondary | ICD-10-CM | POA: Diagnosis not present

## 2020-06-07 DIAGNOSIS — D649 Anemia, unspecified: Secondary | ICD-10-CM | POA: Diagnosis not present

## 2020-06-07 DIAGNOSIS — I110 Hypertensive heart disease with heart failure: Secondary | ICD-10-CM | POA: Diagnosis not present

## 2020-06-07 DIAGNOSIS — N3281 Overactive bladder: Secondary | ICD-10-CM | POA: Diagnosis not present

## 2020-06-07 DIAGNOSIS — J449 Chronic obstructive pulmonary disease, unspecified: Secondary | ICD-10-CM | POA: Diagnosis not present

## 2020-06-07 DIAGNOSIS — S72351D Displaced comminuted fracture of shaft of right femur, subsequent encounter for closed fracture with routine healing: Secondary | ICD-10-CM | POA: Diagnosis not present

## 2020-06-07 DIAGNOSIS — E871 Hypo-osmolality and hyponatremia: Secondary | ICD-10-CM | POA: Diagnosis not present

## 2020-06-07 DIAGNOSIS — S72301D Unspecified fracture of shaft of right femur, subsequent encounter for closed fracture with routine healing: Secondary | ICD-10-CM | POA: Diagnosis not present

## 2020-06-07 DIAGNOSIS — S72141D Displaced intertrochanteric fracture of right femur, subsequent encounter for closed fracture with routine healing: Secondary | ICD-10-CM | POA: Diagnosis not present

## 2020-06-07 DIAGNOSIS — G309 Alzheimer's disease, unspecified: Secondary | ICD-10-CM | POA: Diagnosis not present

## 2020-06-07 DIAGNOSIS — S8991XA Unspecified injury of right lower leg, initial encounter: Secondary | ICD-10-CM | POA: Diagnosis not present

## 2020-06-07 DIAGNOSIS — Z593 Problems related to living in residential institution: Secondary | ICD-10-CM | POA: Diagnosis not present

## 2020-06-07 DIAGNOSIS — Z9981 Dependence on supplemental oxygen: Secondary | ICD-10-CM | POA: Diagnosis not present

## 2020-06-07 DIAGNOSIS — Z043 Encounter for examination and observation following other accident: Secondary | ICD-10-CM | POA: Diagnosis not present

## 2020-06-07 DIAGNOSIS — L89153 Pressure ulcer of sacral region, stage 3: Secondary | ICD-10-CM | POA: Diagnosis not present

## 2020-06-07 DIAGNOSIS — E1140 Type 2 diabetes mellitus with diabetic neuropathy, unspecified: Secondary | ICD-10-CM | POA: Diagnosis not present

## 2020-06-07 DIAGNOSIS — L89159 Pressure ulcer of sacral region, unspecified stage: Secondary | ICD-10-CM | POA: Diagnosis not present

## 2020-06-07 DIAGNOSIS — Z66 Do not resuscitate: Secondary | ICD-10-CM | POA: Diagnosis not present

## 2020-06-07 DIAGNOSIS — K743 Primary biliary cirrhosis: Secondary | ICD-10-CM | POA: Diagnosis not present

## 2020-06-07 DIAGNOSIS — N390 Urinary tract infection, site not specified: Secondary | ICD-10-CM | POA: Diagnosis not present

## 2020-06-07 DIAGNOSIS — S72351A Displaced comminuted fracture of shaft of right femur, initial encounter for closed fracture: Secondary | ICD-10-CM | POA: Diagnosis not present

## 2020-06-07 DIAGNOSIS — Z792 Long term (current) use of antibiotics: Secondary | ICD-10-CM | POA: Diagnosis not present

## 2020-06-07 DIAGNOSIS — Z20822 Contact with and (suspected) exposure to covid-19: Secondary | ICD-10-CM | POA: Diagnosis not present

## 2020-06-07 DIAGNOSIS — S72352A Displaced comminuted fracture of shaft of left femur, initial encounter for closed fracture: Secondary | ICD-10-CM | POA: Diagnosis not present

## 2020-06-07 DIAGNOSIS — R710 Precipitous drop in hematocrit: Secondary | ICD-10-CM | POA: Diagnosis not present

## 2020-06-07 DIAGNOSIS — S0990XA Unspecified injury of head, initial encounter: Secondary | ICD-10-CM | POA: Diagnosis not present

## 2020-06-07 DIAGNOSIS — I5032 Chronic diastolic (congestive) heart failure: Secondary | ICD-10-CM | POA: Diagnosis not present

## 2020-06-07 DIAGNOSIS — L89154 Pressure ulcer of sacral region, stage 4: Secondary | ICD-10-CM | POA: Diagnosis not present

## 2020-06-07 DIAGNOSIS — I4891 Unspecified atrial fibrillation: Secondary | ICD-10-CM | POA: Diagnosis not present

## 2020-06-07 DIAGNOSIS — R2689 Other abnormalities of gait and mobility: Secondary | ICD-10-CM | POA: Diagnosis not present

## 2020-06-07 DIAGNOSIS — A419 Sepsis, unspecified organism: Secondary | ICD-10-CM | POA: Diagnosis not present

## 2020-06-07 DIAGNOSIS — S72401A Unspecified fracture of lower end of right femur, initial encounter for closed fracture: Secondary | ICD-10-CM | POA: Diagnosis not present

## 2020-06-07 DIAGNOSIS — Z981 Arthrodesis status: Secondary | ICD-10-CM | POA: Diagnosis not present

## 2020-06-07 DIAGNOSIS — I509 Heart failure, unspecified: Secondary | ICD-10-CM | POA: Diagnosis not present

## 2020-06-07 DIAGNOSIS — J9611 Chronic respiratory failure with hypoxia: Secondary | ICD-10-CM | POA: Diagnosis not present

## 2020-06-07 DIAGNOSIS — E875 Hyperkalemia: Secondary | ICD-10-CM | POA: Diagnosis not present

## 2020-06-07 DIAGNOSIS — R0902 Hypoxemia: Secondary | ICD-10-CM | POA: Diagnosis not present

## 2020-06-07 DIAGNOSIS — W19XXXA Unspecified fall, initial encounter: Secondary | ICD-10-CM | POA: Diagnosis not present

## 2020-06-07 DIAGNOSIS — Z79899 Other long term (current) drug therapy: Secondary | ICD-10-CM | POA: Diagnosis not present

## 2020-06-07 DIAGNOSIS — Z9181 History of falling: Secondary | ICD-10-CM | POA: Diagnosis not present

## 2020-06-14 DIAGNOSIS — S8991XA Unspecified injury of right lower leg, initial encounter: Secondary | ICD-10-CM | POA: Diagnosis not present

## 2020-06-14 DIAGNOSIS — I5032 Chronic diastolic (congestive) heart failure: Secondary | ICD-10-CM | POA: Diagnosis not present

## 2020-06-14 DIAGNOSIS — L89153 Pressure ulcer of sacral region, stage 3: Secondary | ICD-10-CM | POA: Diagnosis not present

## 2020-06-14 DIAGNOSIS — L602 Onychogryphosis: Secondary | ICD-10-CM | POA: Diagnosis not present

## 2020-06-14 DIAGNOSIS — S72351D Displaced comminuted fracture of shaft of right femur, subsequent encounter for closed fracture with routine healing: Secondary | ICD-10-CM | POA: Diagnosis not present

## 2020-06-14 DIAGNOSIS — S72351A Displaced comminuted fracture of shaft of right femur, initial encounter for closed fracture: Secondary | ICD-10-CM | POA: Diagnosis not present

## 2020-06-14 DIAGNOSIS — L89303 Pressure ulcer of unspecified buttock, stage 3: Secondary | ICD-10-CM | POA: Diagnosis not present

## 2020-06-14 DIAGNOSIS — E1140 Type 2 diabetes mellitus with diabetic neuropathy, unspecified: Secondary | ICD-10-CM | POA: Diagnosis not present

## 2020-06-14 DIAGNOSIS — E875 Hyperkalemia: Secondary | ICD-10-CM | POA: Diagnosis not present

## 2020-06-14 DIAGNOSIS — M6281 Muscle weakness (generalized): Secondary | ICD-10-CM | POA: Diagnosis not present

## 2020-06-14 DIAGNOSIS — E785 Hyperlipidemia, unspecified: Secondary | ICD-10-CM | POA: Diagnosis not present

## 2020-06-14 DIAGNOSIS — Z960 Presence of urogenital implants: Secondary | ICD-10-CM | POA: Diagnosis not present

## 2020-06-14 DIAGNOSIS — G309 Alzheimer's disease, unspecified: Secondary | ICD-10-CM | POA: Diagnosis not present

## 2020-06-14 DIAGNOSIS — N3281 Overactive bladder: Secondary | ICD-10-CM | POA: Diagnosis not present

## 2020-06-14 DIAGNOSIS — Z9981 Dependence on supplemental oxygen: Secondary | ICD-10-CM | POA: Diagnosis not present

## 2020-06-14 DIAGNOSIS — I10 Essential (primary) hypertension: Secondary | ICD-10-CM | POA: Diagnosis not present

## 2020-06-14 DIAGNOSIS — W19XXXA Unspecified fall, initial encounter: Secondary | ICD-10-CM | POA: Diagnosis not present

## 2020-06-14 DIAGNOSIS — Z9181 History of falling: Secondary | ICD-10-CM | POA: Diagnosis not present

## 2020-06-14 DIAGNOSIS — I110 Hypertensive heart disease with heart failure: Secondary | ICD-10-CM | POA: Diagnosis not present

## 2020-06-14 DIAGNOSIS — J449 Chronic obstructive pulmonary disease, unspecified: Secondary | ICD-10-CM | POA: Diagnosis not present

## 2020-06-14 DIAGNOSIS — R2689 Other abnormalities of gait and mobility: Secondary | ICD-10-CM | POA: Diagnosis not present

## 2020-06-14 DIAGNOSIS — J9611 Chronic respiratory failure with hypoxia: Secondary | ICD-10-CM | POA: Diagnosis not present

## 2020-06-14 DIAGNOSIS — Z466 Encounter for fitting and adjustment of urinary device: Secondary | ICD-10-CM | POA: Diagnosis not present

## 2020-06-14 DIAGNOSIS — N390 Urinary tract infection, site not specified: Secondary | ICD-10-CM | POA: Diagnosis not present

## 2020-06-14 DIAGNOSIS — S7291XA Unspecified fracture of right femur, initial encounter for closed fracture: Secondary | ICD-10-CM | POA: Diagnosis not present

## 2020-06-18 DIAGNOSIS — S7291XA Unspecified fracture of right femur, initial encounter for closed fracture: Secondary | ICD-10-CM | POA: Diagnosis not present

## 2020-06-18 DIAGNOSIS — I10 Essential (primary) hypertension: Secondary | ICD-10-CM | POA: Diagnosis not present

## 2020-06-18 DIAGNOSIS — L89303 Pressure ulcer of unspecified buttock, stage 3: Secondary | ICD-10-CM | POA: Diagnosis not present

## 2020-06-26 DIAGNOSIS — S72351A Displaced comminuted fracture of shaft of right femur, initial encounter for closed fracture: Secondary | ICD-10-CM | POA: Diagnosis not present

## 2020-06-27 DIAGNOSIS — E1140 Type 2 diabetes mellitus with diabetic neuropathy, unspecified: Secondary | ICD-10-CM | POA: Diagnosis not present

## 2020-07-05 DIAGNOSIS — E119 Type 2 diabetes mellitus without complications: Secondary | ICD-10-CM | POA: Diagnosis not present

## 2020-07-05 DIAGNOSIS — L89153 Pressure ulcer of sacral region, stage 3: Secondary | ICD-10-CM | POA: Diagnosis not present

## 2020-07-05 DIAGNOSIS — Z7901 Long term (current) use of anticoagulants: Secondary | ICD-10-CM | POA: Diagnosis not present

## 2020-07-05 DIAGNOSIS — Z79899 Other long term (current) drug therapy: Secondary | ICD-10-CM | POA: Diagnosis not present

## 2020-07-05 DIAGNOSIS — J449 Chronic obstructive pulmonary disease, unspecified: Secondary | ICD-10-CM | POA: Diagnosis not present

## 2020-07-05 DIAGNOSIS — Z89421 Acquired absence of other right toe(s): Secondary | ICD-10-CM | POA: Diagnosis not present

## 2020-07-10 DIAGNOSIS — N390 Urinary tract infection, site not specified: Secondary | ICD-10-CM | POA: Diagnosis not present

## 2020-07-11 DIAGNOSIS — R2689 Other abnormalities of gait and mobility: Secondary | ICD-10-CM | POA: Diagnosis not present

## 2020-07-11 DIAGNOSIS — M84751D Incomplete atypical femoral fracture, right leg, subsequent encounter for fracture with routine healing: Secondary | ICD-10-CM | POA: Diagnosis not present

## 2020-07-11 DIAGNOSIS — S72351D Displaced comminuted fracture of shaft of right femur, subsequent encounter for closed fracture with routine healing: Secondary | ICD-10-CM | POA: Diagnosis not present

## 2020-07-11 DIAGNOSIS — M6281 Muscle weakness (generalized): Secondary | ICD-10-CM | POA: Diagnosis not present

## 2020-07-12 DIAGNOSIS — M6281 Muscle weakness (generalized): Secondary | ICD-10-CM | POA: Diagnosis not present

## 2020-07-12 DIAGNOSIS — S72351D Displaced comminuted fracture of shaft of right femur, subsequent encounter for closed fracture with routine healing: Secondary | ICD-10-CM | POA: Diagnosis not present

## 2020-07-12 DIAGNOSIS — R2689 Other abnormalities of gait and mobility: Secondary | ICD-10-CM | POA: Diagnosis not present

## 2020-07-12 DIAGNOSIS — M84751D Incomplete atypical femoral fracture, right leg, subsequent encounter for fracture with routine healing: Secondary | ICD-10-CM | POA: Diagnosis not present

## 2020-07-16 DIAGNOSIS — S72351D Displaced comminuted fracture of shaft of right femur, subsequent encounter for closed fracture with routine healing: Secondary | ICD-10-CM | POA: Diagnosis not present

## 2020-07-16 DIAGNOSIS — M84751D Incomplete atypical femoral fracture, right leg, subsequent encounter for fracture with routine healing: Secondary | ICD-10-CM | POA: Diagnosis not present

## 2020-07-16 DIAGNOSIS — R2689 Other abnormalities of gait and mobility: Secondary | ICD-10-CM | POA: Diagnosis not present

## 2020-07-16 DIAGNOSIS — M6281 Muscle weakness (generalized): Secondary | ICD-10-CM | POA: Diagnosis not present

## 2020-07-17 DIAGNOSIS — S72351D Displaced comminuted fracture of shaft of right femur, subsequent encounter for closed fracture with routine healing: Secondary | ICD-10-CM | POA: Diagnosis not present

## 2020-07-17 DIAGNOSIS — R2689 Other abnormalities of gait and mobility: Secondary | ICD-10-CM | POA: Diagnosis not present

## 2020-07-17 DIAGNOSIS — M6281 Muscle weakness (generalized): Secondary | ICD-10-CM | POA: Diagnosis not present

## 2020-07-17 DIAGNOSIS — M84751D Incomplete atypical femoral fracture, right leg, subsequent encounter for fracture with routine healing: Secondary | ICD-10-CM | POA: Diagnosis not present

## 2020-07-18 DIAGNOSIS — M6281 Muscle weakness (generalized): Secondary | ICD-10-CM | POA: Diagnosis not present

## 2020-07-18 DIAGNOSIS — L89154 Pressure ulcer of sacral region, stage 4: Secondary | ICD-10-CM | POA: Diagnosis not present

## 2020-07-18 DIAGNOSIS — Z79899 Other long term (current) drug therapy: Secondary | ICD-10-CM | POA: Diagnosis not present

## 2020-07-18 DIAGNOSIS — M84751D Incomplete atypical femoral fracture, right leg, subsequent encounter for fracture with routine healing: Secondary | ICD-10-CM | POA: Diagnosis not present

## 2020-07-18 DIAGNOSIS — S72351D Displaced comminuted fracture of shaft of right femur, subsequent encounter for closed fracture with routine healing: Secondary | ICD-10-CM | POA: Diagnosis not present

## 2020-07-18 DIAGNOSIS — R2689 Other abnormalities of gait and mobility: Secondary | ICD-10-CM | POA: Diagnosis not present

## 2020-07-18 DIAGNOSIS — J449 Chronic obstructive pulmonary disease, unspecified: Secondary | ICD-10-CM | POA: Diagnosis not present

## 2020-07-18 DIAGNOSIS — E1151 Type 2 diabetes mellitus with diabetic peripheral angiopathy without gangrene: Secondary | ICD-10-CM | POA: Diagnosis not present

## 2020-07-18 DIAGNOSIS — Z7901 Long term (current) use of anticoagulants: Secondary | ICD-10-CM | POA: Diagnosis not present

## 2020-07-19 DIAGNOSIS — S72351D Displaced comminuted fracture of shaft of right femur, subsequent encounter for closed fracture with routine healing: Secondary | ICD-10-CM | POA: Diagnosis not present

## 2020-07-19 DIAGNOSIS — M6281 Muscle weakness (generalized): Secondary | ICD-10-CM | POA: Diagnosis not present

## 2020-07-19 DIAGNOSIS — M84751D Incomplete atypical femoral fracture, right leg, subsequent encounter for fracture with routine healing: Secondary | ICD-10-CM | POA: Diagnosis not present

## 2020-07-19 DIAGNOSIS — R2689 Other abnormalities of gait and mobility: Secondary | ICD-10-CM | POA: Diagnosis not present

## 2020-07-20 DIAGNOSIS — R2689 Other abnormalities of gait and mobility: Secondary | ICD-10-CM | POA: Diagnosis not present

## 2020-07-20 DIAGNOSIS — S72351D Displaced comminuted fracture of shaft of right femur, subsequent encounter for closed fracture with routine healing: Secondary | ICD-10-CM | POA: Diagnosis not present

## 2020-07-20 DIAGNOSIS — M84751D Incomplete atypical femoral fracture, right leg, subsequent encounter for fracture with routine healing: Secondary | ICD-10-CM | POA: Diagnosis not present

## 2020-07-20 DIAGNOSIS — M6281 Muscle weakness (generalized): Secondary | ICD-10-CM | POA: Diagnosis not present

## 2020-07-21 DIAGNOSIS — M6281 Muscle weakness (generalized): Secondary | ICD-10-CM | POA: Diagnosis not present

## 2020-07-21 DIAGNOSIS — M84751D Incomplete atypical femoral fracture, right leg, subsequent encounter for fracture with routine healing: Secondary | ICD-10-CM | POA: Diagnosis not present

## 2020-07-21 DIAGNOSIS — R2689 Other abnormalities of gait and mobility: Secondary | ICD-10-CM | POA: Diagnosis not present

## 2020-07-21 DIAGNOSIS — S72351D Displaced comminuted fracture of shaft of right femur, subsequent encounter for closed fracture with routine healing: Secondary | ICD-10-CM | POA: Diagnosis not present

## 2020-07-22 DIAGNOSIS — R2689 Other abnormalities of gait and mobility: Secondary | ICD-10-CM | POA: Diagnosis not present

## 2020-07-22 DIAGNOSIS — S72351D Displaced comminuted fracture of shaft of right femur, subsequent encounter for closed fracture with routine healing: Secondary | ICD-10-CM | POA: Diagnosis not present

## 2020-07-22 DIAGNOSIS — M84751D Incomplete atypical femoral fracture, right leg, subsequent encounter for fracture with routine healing: Secondary | ICD-10-CM | POA: Diagnosis not present

## 2020-07-22 DIAGNOSIS — M6281 Muscle weakness (generalized): Secondary | ICD-10-CM | POA: Diagnosis not present

## 2020-07-23 DIAGNOSIS — S7291XA Unspecified fracture of right femur, initial encounter for closed fracture: Secondary | ICD-10-CM | POA: Diagnosis not present

## 2020-07-23 DIAGNOSIS — I5032 Chronic diastolic (congestive) heart failure: Secondary | ICD-10-CM | POA: Diagnosis not present

## 2020-07-23 DIAGNOSIS — Z299 Encounter for prophylactic measures, unspecified: Secondary | ICD-10-CM | POA: Diagnosis not present

## 2020-07-23 DIAGNOSIS — K7460 Unspecified cirrhosis of liver: Secondary | ICD-10-CM | POA: Diagnosis not present

## 2020-07-23 DIAGNOSIS — R2689 Other abnormalities of gait and mobility: Secondary | ICD-10-CM | POA: Diagnosis not present

## 2020-07-23 DIAGNOSIS — S72351D Displaced comminuted fracture of shaft of right femur, subsequent encounter for closed fracture with routine healing: Secondary | ICD-10-CM | POA: Diagnosis not present

## 2020-07-23 DIAGNOSIS — M84751D Incomplete atypical femoral fracture, right leg, subsequent encounter for fracture with routine healing: Secondary | ICD-10-CM | POA: Diagnosis not present

## 2020-07-23 DIAGNOSIS — M6281 Muscle weakness (generalized): Secondary | ICD-10-CM | POA: Diagnosis not present

## 2020-07-23 DIAGNOSIS — I739 Peripheral vascular disease, unspecified: Secondary | ICD-10-CM | POA: Diagnosis not present

## 2020-07-24 DIAGNOSIS — M84751D Incomplete atypical femoral fracture, right leg, subsequent encounter for fracture with routine healing: Secondary | ICD-10-CM | POA: Diagnosis not present

## 2020-07-24 DIAGNOSIS — M6281 Muscle weakness (generalized): Secondary | ICD-10-CM | POA: Diagnosis not present

## 2020-07-24 DIAGNOSIS — R2689 Other abnormalities of gait and mobility: Secondary | ICD-10-CM | POA: Diagnosis not present

## 2020-07-24 DIAGNOSIS — S72351D Displaced comminuted fracture of shaft of right femur, subsequent encounter for closed fracture with routine healing: Secondary | ICD-10-CM | POA: Diagnosis not present

## 2020-07-25 DIAGNOSIS — S72401A Unspecified fracture of lower end of right femur, initial encounter for closed fracture: Secondary | ICD-10-CM | POA: Diagnosis not present

## 2020-07-25 DIAGNOSIS — M7989 Other specified soft tissue disorders: Secondary | ICD-10-CM | POA: Diagnosis not present

## 2020-07-25 DIAGNOSIS — Z9889 Other specified postprocedural states: Secondary | ICD-10-CM | POA: Diagnosis not present

## 2020-07-25 DIAGNOSIS — S72351A Displaced comminuted fracture of shaft of right femur, initial encounter for closed fracture: Secondary | ICD-10-CM | POA: Diagnosis not present

## 2020-07-26 DIAGNOSIS — M6281 Muscle weakness (generalized): Secondary | ICD-10-CM | POA: Diagnosis not present

## 2020-07-26 DIAGNOSIS — R2689 Other abnormalities of gait and mobility: Secondary | ICD-10-CM | POA: Diagnosis not present

## 2020-07-26 DIAGNOSIS — M84751D Incomplete atypical femoral fracture, right leg, subsequent encounter for fracture with routine healing: Secondary | ICD-10-CM | POA: Diagnosis not present

## 2020-07-26 DIAGNOSIS — S72351D Displaced comminuted fracture of shaft of right femur, subsequent encounter for closed fracture with routine healing: Secondary | ICD-10-CM | POA: Diagnosis not present

## 2020-07-29 DIAGNOSIS — M6281 Muscle weakness (generalized): Secondary | ICD-10-CM | POA: Diagnosis not present

## 2020-07-29 DIAGNOSIS — M84751D Incomplete atypical femoral fracture, right leg, subsequent encounter for fracture with routine healing: Secondary | ICD-10-CM | POA: Diagnosis not present

## 2020-07-29 DIAGNOSIS — S72351D Displaced comminuted fracture of shaft of right femur, subsequent encounter for closed fracture with routine healing: Secondary | ICD-10-CM | POA: Diagnosis not present

## 2020-07-30 DIAGNOSIS — M6281 Muscle weakness (generalized): Secondary | ICD-10-CM | POA: Diagnosis not present

## 2020-07-30 DIAGNOSIS — M84751D Incomplete atypical femoral fracture, right leg, subsequent encounter for fracture with routine healing: Secondary | ICD-10-CM | POA: Diagnosis not present

## 2020-07-30 DIAGNOSIS — S72351D Displaced comminuted fracture of shaft of right femur, subsequent encounter for closed fracture with routine healing: Secondary | ICD-10-CM | POA: Diagnosis not present

## 2020-07-31 DIAGNOSIS — M84751D Incomplete atypical femoral fracture, right leg, subsequent encounter for fracture with routine healing: Secondary | ICD-10-CM | POA: Diagnosis not present

## 2020-07-31 DIAGNOSIS — M6281 Muscle weakness (generalized): Secondary | ICD-10-CM | POA: Diagnosis not present

## 2020-07-31 DIAGNOSIS — S72351D Displaced comminuted fracture of shaft of right femur, subsequent encounter for closed fracture with routine healing: Secondary | ICD-10-CM | POA: Diagnosis not present

## 2020-08-01 DIAGNOSIS — M84751D Incomplete atypical femoral fracture, right leg, subsequent encounter for fracture with routine healing: Secondary | ICD-10-CM | POA: Diagnosis not present

## 2020-08-01 DIAGNOSIS — M6281 Muscle weakness (generalized): Secondary | ICD-10-CM | POA: Diagnosis not present

## 2020-08-01 DIAGNOSIS — S72351D Displaced comminuted fracture of shaft of right femur, subsequent encounter for closed fracture with routine healing: Secondary | ICD-10-CM | POA: Diagnosis not present

## 2020-08-02 DIAGNOSIS — S72351D Displaced comminuted fracture of shaft of right femur, subsequent encounter for closed fracture with routine healing: Secondary | ICD-10-CM | POA: Diagnosis not present

## 2020-08-02 DIAGNOSIS — M6281 Muscle weakness (generalized): Secondary | ICD-10-CM | POA: Diagnosis not present

## 2020-08-02 DIAGNOSIS — M84751D Incomplete atypical femoral fracture, right leg, subsequent encounter for fracture with routine healing: Secondary | ICD-10-CM | POA: Diagnosis not present

## 2020-08-03 DIAGNOSIS — M6281 Muscle weakness (generalized): Secondary | ICD-10-CM | POA: Diagnosis not present

## 2020-08-03 DIAGNOSIS — S72351D Displaced comminuted fracture of shaft of right femur, subsequent encounter for closed fracture with routine healing: Secondary | ICD-10-CM | POA: Diagnosis not present

## 2020-08-03 DIAGNOSIS — M84751D Incomplete atypical femoral fracture, right leg, subsequent encounter for fracture with routine healing: Secondary | ICD-10-CM | POA: Diagnosis not present

## 2020-08-05 DIAGNOSIS — M84751D Incomplete atypical femoral fracture, right leg, subsequent encounter for fracture with routine healing: Secondary | ICD-10-CM | POA: Diagnosis not present

## 2020-08-05 DIAGNOSIS — S72351D Displaced comminuted fracture of shaft of right femur, subsequent encounter for closed fracture with routine healing: Secondary | ICD-10-CM | POA: Diagnosis not present

## 2020-08-05 DIAGNOSIS — M6281 Muscle weakness (generalized): Secondary | ICD-10-CM | POA: Diagnosis not present

## 2020-08-06 DIAGNOSIS — J449 Chronic obstructive pulmonary disease, unspecified: Secondary | ICD-10-CM | POA: Diagnosis not present

## 2020-08-06 DIAGNOSIS — E119 Type 2 diabetes mellitus without complications: Secondary | ICD-10-CM | POA: Diagnosis not present

## 2020-08-06 DIAGNOSIS — S72351D Displaced comminuted fracture of shaft of right femur, subsequent encounter for closed fracture with routine healing: Secondary | ICD-10-CM | POA: Diagnosis not present

## 2020-08-06 DIAGNOSIS — M84751D Incomplete atypical femoral fracture, right leg, subsequent encounter for fracture with routine healing: Secondary | ICD-10-CM | POA: Diagnosis not present

## 2020-08-06 DIAGNOSIS — L89154 Pressure ulcer of sacral region, stage 4: Secondary | ICD-10-CM | POA: Diagnosis not present

## 2020-08-06 DIAGNOSIS — Z4800 Encounter for change or removal of nonsurgical wound dressing: Secondary | ICD-10-CM | POA: Diagnosis not present

## 2020-08-06 DIAGNOSIS — Z79899 Other long term (current) drug therapy: Secondary | ICD-10-CM | POA: Diagnosis not present

## 2020-08-06 DIAGNOSIS — M6281 Muscle weakness (generalized): Secondary | ICD-10-CM | POA: Diagnosis not present

## 2020-08-07 DIAGNOSIS — S72351D Displaced comminuted fracture of shaft of right femur, subsequent encounter for closed fracture with routine healing: Secondary | ICD-10-CM | POA: Diagnosis not present

## 2020-08-07 DIAGNOSIS — M84751D Incomplete atypical femoral fracture, right leg, subsequent encounter for fracture with routine healing: Secondary | ICD-10-CM | POA: Diagnosis not present

## 2020-08-07 DIAGNOSIS — M6281 Muscle weakness (generalized): Secondary | ICD-10-CM | POA: Diagnosis not present

## 2020-08-08 DIAGNOSIS — M84751D Incomplete atypical femoral fracture, right leg, subsequent encounter for fracture with routine healing: Secondary | ICD-10-CM | POA: Diagnosis not present

## 2020-08-08 DIAGNOSIS — S72351D Displaced comminuted fracture of shaft of right femur, subsequent encounter for closed fracture with routine healing: Secondary | ICD-10-CM | POA: Diagnosis not present

## 2020-08-08 DIAGNOSIS — M6281 Muscle weakness (generalized): Secondary | ICD-10-CM | POA: Diagnosis not present

## 2020-08-09 DIAGNOSIS — S72351D Displaced comminuted fracture of shaft of right femur, subsequent encounter for closed fracture with routine healing: Secondary | ICD-10-CM | POA: Diagnosis not present

## 2020-08-09 DIAGNOSIS — M6281 Muscle weakness (generalized): Secondary | ICD-10-CM | POA: Diagnosis not present

## 2020-08-09 DIAGNOSIS — M84751D Incomplete atypical femoral fracture, right leg, subsequent encounter for fracture with routine healing: Secondary | ICD-10-CM | POA: Diagnosis not present

## 2020-08-10 DIAGNOSIS — S72351D Displaced comminuted fracture of shaft of right femur, subsequent encounter for closed fracture with routine healing: Secondary | ICD-10-CM | POA: Diagnosis not present

## 2020-08-10 DIAGNOSIS — M6281 Muscle weakness (generalized): Secondary | ICD-10-CM | POA: Diagnosis not present

## 2020-08-10 DIAGNOSIS — M84751D Incomplete atypical femoral fracture, right leg, subsequent encounter for fracture with routine healing: Secondary | ICD-10-CM | POA: Diagnosis not present

## 2020-08-12 DIAGNOSIS — M84751D Incomplete atypical femoral fracture, right leg, subsequent encounter for fracture with routine healing: Secondary | ICD-10-CM | POA: Diagnosis not present

## 2020-08-12 DIAGNOSIS — M6281 Muscle weakness (generalized): Secondary | ICD-10-CM | POA: Diagnosis not present

## 2020-08-12 DIAGNOSIS — S72351D Displaced comminuted fracture of shaft of right femur, subsequent encounter for closed fracture with routine healing: Secondary | ICD-10-CM | POA: Diagnosis not present

## 2020-08-13 DIAGNOSIS — M6281 Muscle weakness (generalized): Secondary | ICD-10-CM | POA: Diagnosis not present

## 2020-08-13 DIAGNOSIS — S98111A Complete traumatic amputation of right great toe, initial encounter: Secondary | ICD-10-CM | POA: Diagnosis not present

## 2020-08-13 DIAGNOSIS — G629 Polyneuropathy, unspecified: Secondary | ICD-10-CM | POA: Diagnosis not present

## 2020-08-13 DIAGNOSIS — M84751D Incomplete atypical femoral fracture, right leg, subsequent encounter for fracture with routine healing: Secondary | ICD-10-CM | POA: Diagnosis not present

## 2020-08-13 DIAGNOSIS — I5032 Chronic diastolic (congestive) heart failure: Secondary | ICD-10-CM | POA: Diagnosis not present

## 2020-08-13 DIAGNOSIS — S72351D Displaced comminuted fracture of shaft of right femur, subsequent encounter for closed fracture with routine healing: Secondary | ICD-10-CM | POA: Diagnosis not present

## 2020-08-13 DIAGNOSIS — Z299 Encounter for prophylactic measures, unspecified: Secondary | ICD-10-CM | POA: Diagnosis not present

## 2020-08-14 DIAGNOSIS — S72351D Displaced comminuted fracture of shaft of right femur, subsequent encounter for closed fracture with routine healing: Secondary | ICD-10-CM | POA: Diagnosis not present

## 2020-08-14 DIAGNOSIS — M84751D Incomplete atypical femoral fracture, right leg, subsequent encounter for fracture with routine healing: Secondary | ICD-10-CM | POA: Diagnosis not present

## 2020-08-14 DIAGNOSIS — M6281 Muscle weakness (generalized): Secondary | ICD-10-CM | POA: Diagnosis not present

## 2020-08-19 DIAGNOSIS — M84751D Incomplete atypical femoral fracture, right leg, subsequent encounter for fracture with routine healing: Secondary | ICD-10-CM | POA: Diagnosis not present

## 2020-08-19 DIAGNOSIS — S72351D Displaced comminuted fracture of shaft of right femur, subsequent encounter for closed fracture with routine healing: Secondary | ICD-10-CM | POA: Diagnosis not present

## 2020-08-19 DIAGNOSIS — M6281 Muscle weakness (generalized): Secondary | ICD-10-CM | POA: Diagnosis not present

## 2020-08-20 DIAGNOSIS — S72351D Displaced comminuted fracture of shaft of right femur, subsequent encounter for closed fracture with routine healing: Secondary | ICD-10-CM | POA: Diagnosis not present

## 2020-08-20 DIAGNOSIS — M6281 Muscle weakness (generalized): Secondary | ICD-10-CM | POA: Diagnosis not present

## 2020-08-20 DIAGNOSIS — M84751D Incomplete atypical femoral fracture, right leg, subsequent encounter for fracture with routine healing: Secondary | ICD-10-CM | POA: Diagnosis not present

## 2020-08-21 DIAGNOSIS — M6281 Muscle weakness (generalized): Secondary | ICD-10-CM | POA: Diagnosis not present

## 2020-08-21 DIAGNOSIS — M84751D Incomplete atypical femoral fracture, right leg, subsequent encounter for fracture with routine healing: Secondary | ICD-10-CM | POA: Diagnosis not present

## 2020-08-21 DIAGNOSIS — S72351D Displaced comminuted fracture of shaft of right femur, subsequent encounter for closed fracture with routine healing: Secondary | ICD-10-CM | POA: Diagnosis not present

## 2020-08-22 DIAGNOSIS — M84751D Incomplete atypical femoral fracture, right leg, subsequent encounter for fracture with routine healing: Secondary | ICD-10-CM | POA: Diagnosis not present

## 2020-08-22 DIAGNOSIS — M6281 Muscle weakness (generalized): Secondary | ICD-10-CM | POA: Diagnosis not present

## 2020-08-22 DIAGNOSIS — S72351D Displaced comminuted fracture of shaft of right femur, subsequent encounter for closed fracture with routine healing: Secondary | ICD-10-CM | POA: Diagnosis not present

## 2020-08-23 DIAGNOSIS — S72351D Displaced comminuted fracture of shaft of right femur, subsequent encounter for closed fracture with routine healing: Secondary | ICD-10-CM | POA: Diagnosis not present

## 2020-08-23 DIAGNOSIS — M84751D Incomplete atypical femoral fracture, right leg, subsequent encounter for fracture with routine healing: Secondary | ICD-10-CM | POA: Diagnosis not present

## 2020-08-23 DIAGNOSIS — M6281 Muscle weakness (generalized): Secondary | ICD-10-CM | POA: Diagnosis not present

## 2020-08-26 DIAGNOSIS — M6281 Muscle weakness (generalized): Secondary | ICD-10-CM | POA: Diagnosis not present

## 2020-08-26 DIAGNOSIS — S72351D Displaced comminuted fracture of shaft of right femur, subsequent encounter for closed fracture with routine healing: Secondary | ICD-10-CM | POA: Diagnosis not present

## 2020-08-26 DIAGNOSIS — M84751D Incomplete atypical femoral fracture, right leg, subsequent encounter for fracture with routine healing: Secondary | ICD-10-CM | POA: Diagnosis not present

## 2020-08-27 DIAGNOSIS — E119 Type 2 diabetes mellitus without complications: Secondary | ICD-10-CM | POA: Diagnosis not present

## 2020-08-27 DIAGNOSIS — S72351D Displaced comminuted fracture of shaft of right femur, subsequent encounter for closed fracture with routine healing: Secondary | ICD-10-CM | POA: Diagnosis not present

## 2020-08-27 DIAGNOSIS — M84751D Incomplete atypical femoral fracture, right leg, subsequent encounter for fracture with routine healing: Secondary | ICD-10-CM | POA: Diagnosis not present

## 2020-08-27 DIAGNOSIS — L89154 Pressure ulcer of sacral region, stage 4: Secondary | ICD-10-CM | POA: Diagnosis not present

## 2020-08-27 DIAGNOSIS — Z4800 Encounter for change or removal of nonsurgical wound dressing: Secondary | ICD-10-CM | POA: Diagnosis not present

## 2020-08-27 DIAGNOSIS — J449 Chronic obstructive pulmonary disease, unspecified: Secondary | ICD-10-CM | POA: Diagnosis not present

## 2020-08-27 DIAGNOSIS — Z79899 Other long term (current) drug therapy: Secondary | ICD-10-CM | POA: Diagnosis not present

## 2020-08-27 DIAGNOSIS — M6281 Muscle weakness (generalized): Secondary | ICD-10-CM | POA: Diagnosis not present

## 2020-08-28 DIAGNOSIS — M6281 Muscle weakness (generalized): Secondary | ICD-10-CM | POA: Diagnosis not present

## 2020-08-28 DIAGNOSIS — S72351D Displaced comminuted fracture of shaft of right femur, subsequent encounter for closed fracture with routine healing: Secondary | ICD-10-CM | POA: Diagnosis not present

## 2020-08-28 DIAGNOSIS — R1310 Dysphagia, unspecified: Secondary | ICD-10-CM | POA: Diagnosis not present

## 2020-08-28 DIAGNOSIS — R2689 Other abnormalities of gait and mobility: Secondary | ICD-10-CM | POA: Diagnosis not present

## 2020-08-28 DIAGNOSIS — M84751D Incomplete atypical femoral fracture, right leg, subsequent encounter for fracture with routine healing: Secondary | ICD-10-CM | POA: Diagnosis not present

## 2020-08-30 DIAGNOSIS — R1310 Dysphagia, unspecified: Secondary | ICD-10-CM | POA: Diagnosis not present

## 2020-08-30 DIAGNOSIS — R2689 Other abnormalities of gait and mobility: Secondary | ICD-10-CM | POA: Diagnosis not present

## 2020-08-30 DIAGNOSIS — M84751D Incomplete atypical femoral fracture, right leg, subsequent encounter for fracture with routine healing: Secondary | ICD-10-CM | POA: Diagnosis not present

## 2020-08-30 DIAGNOSIS — S72351D Displaced comminuted fracture of shaft of right femur, subsequent encounter for closed fracture with routine healing: Secondary | ICD-10-CM | POA: Diagnosis not present

## 2020-08-30 DIAGNOSIS — M6281 Muscle weakness (generalized): Secondary | ICD-10-CM | POA: Diagnosis not present

## 2020-08-31 DIAGNOSIS — M84751D Incomplete atypical femoral fracture, right leg, subsequent encounter for fracture with routine healing: Secondary | ICD-10-CM | POA: Diagnosis not present

## 2020-08-31 DIAGNOSIS — R1310 Dysphagia, unspecified: Secondary | ICD-10-CM | POA: Diagnosis not present

## 2020-08-31 DIAGNOSIS — R2689 Other abnormalities of gait and mobility: Secondary | ICD-10-CM | POA: Diagnosis not present

## 2020-08-31 DIAGNOSIS — S72351D Displaced comminuted fracture of shaft of right femur, subsequent encounter for closed fracture with routine healing: Secondary | ICD-10-CM | POA: Diagnosis not present

## 2020-08-31 DIAGNOSIS — M6281 Muscle weakness (generalized): Secondary | ICD-10-CM | POA: Diagnosis not present

## 2020-09-03 DIAGNOSIS — S72351D Displaced comminuted fracture of shaft of right femur, subsequent encounter for closed fracture with routine healing: Secondary | ICD-10-CM | POA: Diagnosis not present

## 2020-09-03 DIAGNOSIS — M6281 Muscle weakness (generalized): Secondary | ICD-10-CM | POA: Diagnosis not present

## 2020-09-03 DIAGNOSIS — M84751D Incomplete atypical femoral fracture, right leg, subsequent encounter for fracture with routine healing: Secondary | ICD-10-CM | POA: Diagnosis not present

## 2020-09-03 DIAGNOSIS — R1310 Dysphagia, unspecified: Secondary | ICD-10-CM | POA: Diagnosis not present

## 2020-09-03 DIAGNOSIS — R2689 Other abnormalities of gait and mobility: Secondary | ICD-10-CM | POA: Diagnosis not present

## 2020-09-04 DIAGNOSIS — M84751D Incomplete atypical femoral fracture, right leg, subsequent encounter for fracture with routine healing: Secondary | ICD-10-CM | POA: Diagnosis not present

## 2020-09-04 DIAGNOSIS — R1310 Dysphagia, unspecified: Secondary | ICD-10-CM | POA: Diagnosis not present

## 2020-09-04 DIAGNOSIS — S72351D Displaced comminuted fracture of shaft of right femur, subsequent encounter for closed fracture with routine healing: Secondary | ICD-10-CM | POA: Diagnosis not present

## 2020-09-04 DIAGNOSIS — M6281 Muscle weakness (generalized): Secondary | ICD-10-CM | POA: Diagnosis not present

## 2020-09-04 DIAGNOSIS — R2689 Other abnormalities of gait and mobility: Secondary | ICD-10-CM | POA: Diagnosis not present

## 2020-09-05 DIAGNOSIS — M6281 Muscle weakness (generalized): Secondary | ICD-10-CM | POA: Diagnosis not present

## 2020-09-05 DIAGNOSIS — S72351D Displaced comminuted fracture of shaft of right femur, subsequent encounter for closed fracture with routine healing: Secondary | ICD-10-CM | POA: Diagnosis not present

## 2020-09-05 DIAGNOSIS — M84751D Incomplete atypical femoral fracture, right leg, subsequent encounter for fracture with routine healing: Secondary | ICD-10-CM | POA: Diagnosis not present

## 2020-09-05 DIAGNOSIS — R1310 Dysphagia, unspecified: Secondary | ICD-10-CM | POA: Diagnosis not present

## 2020-09-05 DIAGNOSIS — S72351A Displaced comminuted fracture of shaft of right femur, initial encounter for closed fracture: Secondary | ICD-10-CM | POA: Diagnosis not present

## 2020-09-05 DIAGNOSIS — R2689 Other abnormalities of gait and mobility: Secondary | ICD-10-CM | POA: Diagnosis not present

## 2020-09-06 DIAGNOSIS — R1310 Dysphagia, unspecified: Secondary | ICD-10-CM | POA: Diagnosis not present

## 2020-09-06 DIAGNOSIS — S72351D Displaced comminuted fracture of shaft of right femur, subsequent encounter for closed fracture with routine healing: Secondary | ICD-10-CM | POA: Diagnosis not present

## 2020-09-06 DIAGNOSIS — R2689 Other abnormalities of gait and mobility: Secondary | ICD-10-CM | POA: Diagnosis not present

## 2020-09-06 DIAGNOSIS — M84751D Incomplete atypical femoral fracture, right leg, subsequent encounter for fracture with routine healing: Secondary | ICD-10-CM | POA: Diagnosis not present

## 2020-09-06 DIAGNOSIS — M6281 Muscle weakness (generalized): Secondary | ICD-10-CM | POA: Diagnosis not present

## 2020-09-07 DIAGNOSIS — S72351D Displaced comminuted fracture of shaft of right femur, subsequent encounter for closed fracture with routine healing: Secondary | ICD-10-CM | POA: Diagnosis not present

## 2020-09-07 DIAGNOSIS — R1310 Dysphagia, unspecified: Secondary | ICD-10-CM | POA: Diagnosis not present

## 2020-09-07 DIAGNOSIS — M84751D Incomplete atypical femoral fracture, right leg, subsequent encounter for fracture with routine healing: Secondary | ICD-10-CM | POA: Diagnosis not present

## 2020-09-07 DIAGNOSIS — M6281 Muscle weakness (generalized): Secondary | ICD-10-CM | POA: Diagnosis not present

## 2020-09-07 DIAGNOSIS — R2689 Other abnormalities of gait and mobility: Secondary | ICD-10-CM | POA: Diagnosis not present

## 2020-09-09 DIAGNOSIS — R2689 Other abnormalities of gait and mobility: Secondary | ICD-10-CM | POA: Diagnosis not present

## 2020-09-09 DIAGNOSIS — S72351D Displaced comminuted fracture of shaft of right femur, subsequent encounter for closed fracture with routine healing: Secondary | ICD-10-CM | POA: Diagnosis not present

## 2020-09-09 DIAGNOSIS — M84751D Incomplete atypical femoral fracture, right leg, subsequent encounter for fracture with routine healing: Secondary | ICD-10-CM | POA: Diagnosis not present

## 2020-09-09 DIAGNOSIS — M6281 Muscle weakness (generalized): Secondary | ICD-10-CM | POA: Diagnosis not present

## 2020-09-09 DIAGNOSIS — R1310 Dysphagia, unspecified: Secondary | ICD-10-CM | POA: Diagnosis not present

## 2020-09-10 DIAGNOSIS — M84751D Incomplete atypical femoral fracture, right leg, subsequent encounter for fracture with routine healing: Secondary | ICD-10-CM | POA: Diagnosis not present

## 2020-09-10 DIAGNOSIS — Z299 Encounter for prophylactic measures, unspecified: Secondary | ICD-10-CM | POA: Diagnosis not present

## 2020-09-10 DIAGNOSIS — S98111A Complete traumatic amputation of right great toe, initial encounter: Secondary | ICD-10-CM | POA: Diagnosis not present

## 2020-09-10 DIAGNOSIS — R2689 Other abnormalities of gait and mobility: Secondary | ICD-10-CM | POA: Diagnosis not present

## 2020-09-10 DIAGNOSIS — R1310 Dysphagia, unspecified: Secondary | ICD-10-CM | POA: Diagnosis not present

## 2020-09-10 DIAGNOSIS — M6281 Muscle weakness (generalized): Secondary | ICD-10-CM | POA: Diagnosis not present

## 2020-09-10 DIAGNOSIS — R5383 Other fatigue: Secondary | ICD-10-CM | POA: Diagnosis not present

## 2020-09-10 DIAGNOSIS — S72351D Displaced comminuted fracture of shaft of right femur, subsequent encounter for closed fracture with routine healing: Secondary | ICD-10-CM | POA: Diagnosis not present

## 2020-09-11 DIAGNOSIS — R1310 Dysphagia, unspecified: Secondary | ICD-10-CM | POA: Diagnosis not present

## 2020-09-11 DIAGNOSIS — R2689 Other abnormalities of gait and mobility: Secondary | ICD-10-CM | POA: Diagnosis not present

## 2020-09-11 DIAGNOSIS — S72351D Displaced comminuted fracture of shaft of right femur, subsequent encounter for closed fracture with routine healing: Secondary | ICD-10-CM | POA: Diagnosis not present

## 2020-09-11 DIAGNOSIS — M6281 Muscle weakness (generalized): Secondary | ICD-10-CM | POA: Diagnosis not present

## 2020-09-11 DIAGNOSIS — M84751D Incomplete atypical femoral fracture, right leg, subsequent encounter for fracture with routine healing: Secondary | ICD-10-CM | POA: Diagnosis not present

## 2020-09-12 DIAGNOSIS — R2689 Other abnormalities of gait and mobility: Secondary | ICD-10-CM | POA: Diagnosis not present

## 2020-09-12 DIAGNOSIS — R1310 Dysphagia, unspecified: Secondary | ICD-10-CM | POA: Diagnosis not present

## 2020-09-12 DIAGNOSIS — M84751D Incomplete atypical femoral fracture, right leg, subsequent encounter for fracture with routine healing: Secondary | ICD-10-CM | POA: Diagnosis not present

## 2020-09-12 DIAGNOSIS — M6281 Muscle weakness (generalized): Secondary | ICD-10-CM | POA: Diagnosis not present

## 2020-09-12 DIAGNOSIS — S72351D Displaced comminuted fracture of shaft of right femur, subsequent encounter for closed fracture with routine healing: Secondary | ICD-10-CM | POA: Diagnosis not present

## 2020-09-13 DIAGNOSIS — M84751D Incomplete atypical femoral fracture, right leg, subsequent encounter for fracture with routine healing: Secondary | ICD-10-CM | POA: Diagnosis not present

## 2020-09-13 DIAGNOSIS — R1310 Dysphagia, unspecified: Secondary | ICD-10-CM | POA: Diagnosis not present

## 2020-09-13 DIAGNOSIS — M6281 Muscle weakness (generalized): Secondary | ICD-10-CM | POA: Diagnosis not present

## 2020-09-13 DIAGNOSIS — S72351D Displaced comminuted fracture of shaft of right femur, subsequent encounter for closed fracture with routine healing: Secondary | ICD-10-CM | POA: Diagnosis not present

## 2020-09-13 DIAGNOSIS — R2689 Other abnormalities of gait and mobility: Secondary | ICD-10-CM | POA: Diagnosis not present

## 2020-09-17 DIAGNOSIS — Z79899 Other long term (current) drug therapy: Secondary | ICD-10-CM | POA: Diagnosis not present

## 2020-09-17 DIAGNOSIS — R1310 Dysphagia, unspecified: Secondary | ICD-10-CM | POA: Diagnosis not present

## 2020-09-17 DIAGNOSIS — E1151 Type 2 diabetes mellitus with diabetic peripheral angiopathy without gangrene: Secondary | ICD-10-CM | POA: Diagnosis not present

## 2020-09-17 DIAGNOSIS — S72351D Displaced comminuted fracture of shaft of right femur, subsequent encounter for closed fracture with routine healing: Secondary | ICD-10-CM | POA: Diagnosis not present

## 2020-09-17 DIAGNOSIS — E11628 Type 2 diabetes mellitus with other skin complications: Secondary | ICD-10-CM | POA: Diagnosis not present

## 2020-09-17 DIAGNOSIS — I4891 Unspecified atrial fibrillation: Secondary | ICD-10-CM | POA: Diagnosis not present

## 2020-09-17 DIAGNOSIS — L89153 Pressure ulcer of sacral region, stage 3: Secondary | ICD-10-CM | POA: Diagnosis not present

## 2020-09-17 DIAGNOSIS — L89154 Pressure ulcer of sacral region, stage 4: Secondary | ICD-10-CM | POA: Diagnosis not present

## 2020-09-17 DIAGNOSIS — M84751D Incomplete atypical femoral fracture, right leg, subsequent encounter for fracture with routine healing: Secondary | ICD-10-CM | POA: Diagnosis not present

## 2020-09-17 DIAGNOSIS — J449 Chronic obstructive pulmonary disease, unspecified: Secondary | ICD-10-CM | POA: Diagnosis not present

## 2020-09-17 DIAGNOSIS — M6281 Muscle weakness (generalized): Secondary | ICD-10-CM | POA: Diagnosis not present

## 2020-09-17 DIAGNOSIS — R2689 Other abnormalities of gait and mobility: Secondary | ICD-10-CM | POA: Diagnosis not present

## 2020-09-19 DIAGNOSIS — R2689 Other abnormalities of gait and mobility: Secondary | ICD-10-CM | POA: Diagnosis not present

## 2020-09-19 DIAGNOSIS — R1310 Dysphagia, unspecified: Secondary | ICD-10-CM | POA: Diagnosis not present

## 2020-09-19 DIAGNOSIS — S72351D Displaced comminuted fracture of shaft of right femur, subsequent encounter for closed fracture with routine healing: Secondary | ICD-10-CM | POA: Diagnosis not present

## 2020-09-19 DIAGNOSIS — M6281 Muscle weakness (generalized): Secondary | ICD-10-CM | POA: Diagnosis not present

## 2020-09-19 DIAGNOSIS — M84751D Incomplete atypical femoral fracture, right leg, subsequent encounter for fracture with routine healing: Secondary | ICD-10-CM | POA: Diagnosis not present

## 2020-09-21 DIAGNOSIS — S72351D Displaced comminuted fracture of shaft of right femur, subsequent encounter for closed fracture with routine healing: Secondary | ICD-10-CM | POA: Diagnosis not present

## 2020-09-21 DIAGNOSIS — R1310 Dysphagia, unspecified: Secondary | ICD-10-CM | POA: Diagnosis not present

## 2020-09-21 DIAGNOSIS — M6281 Muscle weakness (generalized): Secondary | ICD-10-CM | POA: Diagnosis not present

## 2020-09-21 DIAGNOSIS — M84751D Incomplete atypical femoral fracture, right leg, subsequent encounter for fracture with routine healing: Secondary | ICD-10-CM | POA: Diagnosis not present

## 2020-09-21 DIAGNOSIS — R2689 Other abnormalities of gait and mobility: Secondary | ICD-10-CM | POA: Diagnosis not present

## 2020-09-23 DIAGNOSIS — M84751D Incomplete atypical femoral fracture, right leg, subsequent encounter for fracture with routine healing: Secondary | ICD-10-CM | POA: Diagnosis not present

## 2020-09-23 DIAGNOSIS — R2689 Other abnormalities of gait and mobility: Secondary | ICD-10-CM | POA: Diagnosis not present

## 2020-09-23 DIAGNOSIS — R1310 Dysphagia, unspecified: Secondary | ICD-10-CM | POA: Diagnosis not present

## 2020-09-23 DIAGNOSIS — M6281 Muscle weakness (generalized): Secondary | ICD-10-CM | POA: Diagnosis not present

## 2020-09-23 DIAGNOSIS — S72351D Displaced comminuted fracture of shaft of right femur, subsequent encounter for closed fracture with routine healing: Secondary | ICD-10-CM | POA: Diagnosis not present

## 2020-09-25 DIAGNOSIS — M6281 Muscle weakness (generalized): Secondary | ICD-10-CM | POA: Diagnosis not present

## 2020-09-25 DIAGNOSIS — R1310 Dysphagia, unspecified: Secondary | ICD-10-CM | POA: Diagnosis not present

## 2020-09-25 DIAGNOSIS — S72351D Displaced comminuted fracture of shaft of right femur, subsequent encounter for closed fracture with routine healing: Secondary | ICD-10-CM | POA: Diagnosis not present

## 2020-09-25 DIAGNOSIS — M84751D Incomplete atypical femoral fracture, right leg, subsequent encounter for fracture with routine healing: Secondary | ICD-10-CM | POA: Diagnosis not present

## 2020-09-25 DIAGNOSIS — R2689 Other abnormalities of gait and mobility: Secondary | ICD-10-CM | POA: Diagnosis not present

## 2020-09-26 DIAGNOSIS — Z87891 Personal history of nicotine dependence: Secondary | ICD-10-CM | POA: Diagnosis not present

## 2020-09-26 DIAGNOSIS — Z79899 Other long term (current) drug therapy: Secondary | ICD-10-CM | POA: Diagnosis not present

## 2020-09-26 DIAGNOSIS — J449 Chronic obstructive pulmonary disease, unspecified: Secondary | ICD-10-CM | POA: Diagnosis not present

## 2020-09-26 DIAGNOSIS — L89154 Pressure ulcer of sacral region, stage 4: Secondary | ICD-10-CM | POA: Diagnosis not present

## 2020-09-26 DIAGNOSIS — E1151 Type 2 diabetes mellitus with diabetic peripheral angiopathy without gangrene: Secondary | ICD-10-CM | POA: Diagnosis not present

## 2020-09-30 DIAGNOSIS — R1310 Dysphagia, unspecified: Secondary | ICD-10-CM | POA: Diagnosis not present

## 2020-09-30 DIAGNOSIS — S72351D Displaced comminuted fracture of shaft of right femur, subsequent encounter for closed fracture with routine healing: Secondary | ICD-10-CM | POA: Diagnosis not present

## 2020-10-03 DIAGNOSIS — R5383 Other fatigue: Secondary | ICD-10-CM | POA: Diagnosis not present

## 2020-10-03 DIAGNOSIS — Z299 Encounter for prophylactic measures, unspecified: Secondary | ICD-10-CM | POA: Diagnosis not present

## 2020-10-03 DIAGNOSIS — E1165 Type 2 diabetes mellitus with hyperglycemia: Secondary | ICD-10-CM | POA: Diagnosis not present

## 2020-10-03 DIAGNOSIS — I959 Hypotension, unspecified: Secondary | ICD-10-CM | POA: Diagnosis not present

## 2020-10-07 DIAGNOSIS — R1310 Dysphagia, unspecified: Secondary | ICD-10-CM | POA: Diagnosis not present

## 2020-10-07 DIAGNOSIS — S72351D Displaced comminuted fracture of shaft of right femur, subsequent encounter for closed fracture with routine healing: Secondary | ICD-10-CM | POA: Diagnosis not present

## 2020-10-08 DIAGNOSIS — R1310 Dysphagia, unspecified: Secondary | ICD-10-CM | POA: Diagnosis not present

## 2020-10-08 DIAGNOSIS — S72351D Displaced comminuted fracture of shaft of right femur, subsequent encounter for closed fracture with routine healing: Secondary | ICD-10-CM | POA: Diagnosis not present

## 2020-10-09 DIAGNOSIS — R1310 Dysphagia, unspecified: Secondary | ICD-10-CM | POA: Diagnosis not present

## 2020-10-09 DIAGNOSIS — S72351D Displaced comminuted fracture of shaft of right femur, subsequent encounter for closed fracture with routine healing: Secondary | ICD-10-CM | POA: Diagnosis not present

## 2020-10-14 DIAGNOSIS — R1310 Dysphagia, unspecified: Secondary | ICD-10-CM | POA: Diagnosis not present

## 2020-10-14 DIAGNOSIS — S72351D Displaced comminuted fracture of shaft of right femur, subsequent encounter for closed fracture with routine healing: Secondary | ICD-10-CM | POA: Diagnosis not present

## 2020-10-15 DIAGNOSIS — R1310 Dysphagia, unspecified: Secondary | ICD-10-CM | POA: Diagnosis not present

## 2020-10-15 DIAGNOSIS — S72351D Displaced comminuted fracture of shaft of right femur, subsequent encounter for closed fracture with routine healing: Secondary | ICD-10-CM | POA: Diagnosis not present

## 2020-10-16 DIAGNOSIS — S72351D Displaced comminuted fracture of shaft of right femur, subsequent encounter for closed fracture with routine healing: Secondary | ICD-10-CM | POA: Diagnosis not present

## 2020-10-16 DIAGNOSIS — R1310 Dysphagia, unspecified: Secondary | ICD-10-CM | POA: Diagnosis not present

## 2020-10-17 DIAGNOSIS — R1310 Dysphagia, unspecified: Secondary | ICD-10-CM | POA: Diagnosis not present

## 2020-10-17 DIAGNOSIS — J9611 Chronic respiratory failure with hypoxia: Secondary | ICD-10-CM | POA: Diagnosis not present

## 2020-10-17 DIAGNOSIS — S72351D Displaced comminuted fracture of shaft of right femur, subsequent encounter for closed fracture with routine healing: Secondary | ICD-10-CM | POA: Diagnosis not present

## 2020-10-17 DIAGNOSIS — E1165 Type 2 diabetes mellitus with hyperglycemia: Secondary | ICD-10-CM | POA: Diagnosis not present

## 2020-10-17 DIAGNOSIS — Z299 Encounter for prophylactic measures, unspecified: Secondary | ICD-10-CM | POA: Diagnosis not present

## 2020-10-17 DIAGNOSIS — I10 Essential (primary) hypertension: Secondary | ICD-10-CM | POA: Diagnosis not present

## 2020-10-29 DIAGNOSIS — Z299 Encounter for prophylactic measures, unspecified: Secondary | ICD-10-CM | POA: Diagnosis not present

## 2020-10-29 DIAGNOSIS — J449 Chronic obstructive pulmonary disease, unspecified: Secondary | ICD-10-CM | POA: Diagnosis not present

## 2020-10-29 DIAGNOSIS — K7460 Unspecified cirrhosis of liver: Secondary | ICD-10-CM | POA: Diagnosis not present

## 2020-10-29 DIAGNOSIS — S98111A Complete traumatic amputation of right great toe, initial encounter: Secondary | ICD-10-CM | POA: Diagnosis not present

## 2020-10-29 DIAGNOSIS — I10 Essential (primary) hypertension: Secondary | ICD-10-CM | POA: Diagnosis not present

## 2020-11-01 DIAGNOSIS — E11622 Type 2 diabetes mellitus with other skin ulcer: Secondary | ICD-10-CM | POA: Diagnosis not present

## 2020-11-01 DIAGNOSIS — J449 Chronic obstructive pulmonary disease, unspecified: Secondary | ICD-10-CM | POA: Diagnosis not present

## 2020-11-01 DIAGNOSIS — E1151 Type 2 diabetes mellitus with diabetic peripheral angiopathy without gangrene: Secondary | ICD-10-CM | POA: Diagnosis not present

## 2020-11-01 DIAGNOSIS — L89154 Pressure ulcer of sacral region, stage 4: Secondary | ICD-10-CM | POA: Diagnosis not present

## 2020-11-01 DIAGNOSIS — Z87891 Personal history of nicotine dependence: Secondary | ICD-10-CM | POA: Diagnosis not present

## 2020-11-01 DIAGNOSIS — Z79899 Other long term (current) drug therapy: Secondary | ICD-10-CM | POA: Diagnosis not present

## 2020-11-01 DIAGNOSIS — L89153 Pressure ulcer of sacral region, stage 3: Secondary | ICD-10-CM | POA: Diagnosis not present

## 2020-11-01 DIAGNOSIS — I4891 Unspecified atrial fibrillation: Secondary | ICD-10-CM | POA: Diagnosis not present

## 2020-11-12 DIAGNOSIS — W19XXXA Unspecified fall, initial encounter: Secondary | ICD-10-CM | POA: Diagnosis not present

## 2020-11-12 DIAGNOSIS — Z299 Encounter for prophylactic measures, unspecified: Secondary | ICD-10-CM | POA: Diagnosis not present

## 2020-11-12 DIAGNOSIS — I10 Essential (primary) hypertension: Secondary | ICD-10-CM | POA: Diagnosis not present

## 2020-11-12 DIAGNOSIS — R532 Functional quadriplegia: Secondary | ICD-10-CM | POA: Diagnosis not present

## 2020-11-12 DIAGNOSIS — Z0000 Encounter for general adult medical examination without abnormal findings: Secondary | ICD-10-CM | POA: Diagnosis not present

## 2020-11-19 DIAGNOSIS — Z299 Encounter for prophylactic measures, unspecified: Secondary | ICD-10-CM | POA: Diagnosis not present

## 2020-11-19 DIAGNOSIS — Z79899 Other long term (current) drug therapy: Secondary | ICD-10-CM | POA: Diagnosis not present

## 2020-11-19 DIAGNOSIS — I10 Essential (primary) hypertension: Secondary | ICD-10-CM | POA: Diagnosis not present

## 2020-11-19 DIAGNOSIS — E7800 Pure hypercholesterolemia, unspecified: Secondary | ICD-10-CM | POA: Diagnosis not present

## 2020-11-19 DIAGNOSIS — R5383 Other fatigue: Secondary | ICD-10-CM | POA: Diagnosis not present

## 2020-11-19 DIAGNOSIS — Z0000 Encounter for general adult medical examination without abnormal findings: Secondary | ICD-10-CM | POA: Diagnosis not present

## 2020-11-19 DIAGNOSIS — Z7189 Other specified counseling: Secondary | ICD-10-CM | POA: Diagnosis not present

## 2020-11-21 DIAGNOSIS — N390 Urinary tract infection, site not specified: Secondary | ICD-10-CM | POA: Diagnosis not present

## 2020-11-21 DIAGNOSIS — I10 Essential (primary) hypertension: Secondary | ICD-10-CM | POA: Diagnosis not present

## 2020-11-21 DIAGNOSIS — E1165 Type 2 diabetes mellitus with hyperglycemia: Secondary | ICD-10-CM | POA: Diagnosis not present

## 2020-11-21 DIAGNOSIS — Z299 Encounter for prophylactic measures, unspecified: Secondary | ICD-10-CM | POA: Diagnosis not present

## 2020-11-24 DIAGNOSIS — L89154 Pressure ulcer of sacral region, stage 4: Secondary | ICD-10-CM | POA: Diagnosis not present

## 2020-11-24 DIAGNOSIS — L98429 Non-pressure chronic ulcer of back with unspecified severity: Secondary | ICD-10-CM | POA: Diagnosis not present

## 2020-11-24 DIAGNOSIS — R6339 Other feeding difficulties: Secondary | ICD-10-CM | POA: Diagnosis not present

## 2020-11-24 DIAGNOSIS — I071 Rheumatic tricuspid insufficiency: Secondary | ICD-10-CM | POA: Diagnosis not present

## 2020-11-24 DIAGNOSIS — J449 Chronic obstructive pulmonary disease, unspecified: Secondary | ICD-10-CM | POA: Diagnosis not present

## 2020-11-24 DIAGNOSIS — E1165 Type 2 diabetes mellitus with hyperglycemia: Secondary | ICD-10-CM | POA: Diagnosis not present

## 2020-11-24 DIAGNOSIS — E869 Volume depletion, unspecified: Secondary | ICD-10-CM | POA: Diagnosis not present

## 2020-11-24 DIAGNOSIS — N179 Acute kidney failure, unspecified: Secondary | ICD-10-CM | POA: Diagnosis not present

## 2020-11-24 DIAGNOSIS — Z20822 Contact with and (suspected) exposure to covid-19: Secondary | ICD-10-CM | POA: Diagnosis not present

## 2020-11-24 DIAGNOSIS — E8770 Fluid overload, unspecified: Secondary | ICD-10-CM | POA: Diagnosis not present

## 2020-11-24 DIAGNOSIS — E11622 Type 2 diabetes mellitus with other skin ulcer: Secondary | ICD-10-CM | POA: Diagnosis not present

## 2020-11-24 DIAGNOSIS — I959 Hypotension, unspecified: Secondary | ICD-10-CM | POA: Diagnosis not present

## 2020-11-24 DIAGNOSIS — J440 Chronic obstructive pulmonary disease with acute lower respiratory infection: Secondary | ICD-10-CM | POA: Diagnosis not present

## 2020-11-24 DIAGNOSIS — Z1624 Resistance to multiple antibiotics: Secondary | ICD-10-CM | POA: Diagnosis not present

## 2020-11-24 DIAGNOSIS — E1151 Type 2 diabetes mellitus with diabetic peripheral angiopathy without gangrene: Secondary | ICD-10-CM | POA: Diagnosis not present

## 2020-11-24 DIAGNOSIS — Z931 Gastrostomy status: Secondary | ICD-10-CM | POA: Diagnosis not present

## 2020-11-24 DIAGNOSIS — R404 Transient alteration of awareness: Secondary | ICD-10-CM | POA: Diagnosis not present

## 2020-11-24 DIAGNOSIS — I472 Ventricular tachycardia: Secondary | ICD-10-CM | POA: Diagnosis not present

## 2020-11-24 DIAGNOSIS — Z743 Need for continuous supervision: Secondary | ICD-10-CM | POA: Diagnosis not present

## 2020-11-24 DIAGNOSIS — I4891 Unspecified atrial fibrillation: Secondary | ICD-10-CM | POA: Diagnosis not present

## 2020-11-24 DIAGNOSIS — D72829 Elevated white blood cell count, unspecified: Secondary | ICD-10-CM | POA: Diagnosis not present

## 2020-11-24 DIAGNOSIS — D649 Anemia, unspecified: Secondary | ICD-10-CM | POA: Diagnosis not present

## 2020-11-24 DIAGNOSIS — B9689 Other specified bacterial agents as the cause of diseases classified elsewhere: Secondary | ICD-10-CM | POA: Diagnosis not present

## 2020-11-24 DIAGNOSIS — I499 Cardiac arrhythmia, unspecified: Secondary | ICD-10-CM | POA: Diagnosis not present

## 2020-11-24 DIAGNOSIS — Z9981 Dependence on supplemental oxygen: Secondary | ICD-10-CM | POA: Diagnosis not present

## 2020-11-24 DIAGNOSIS — R0600 Dyspnea, unspecified: Secondary | ICD-10-CM | POA: Diagnosis not present

## 2020-11-24 DIAGNOSIS — Z79899 Other long term (current) drug therapy: Secondary | ICD-10-CM | POA: Diagnosis not present

## 2020-11-24 DIAGNOSIS — J9620 Acute and chronic respiratory failure, unspecified whether with hypoxia or hypercapnia: Secondary | ICD-10-CM | POA: Diagnosis not present

## 2020-11-24 DIAGNOSIS — R6889 Other general symptoms and signs: Secondary | ICD-10-CM | POA: Diagnosis not present

## 2020-11-24 DIAGNOSIS — R000 Tachycardia, unspecified: Secondary | ICD-10-CM | POA: Diagnosis not present

## 2020-11-24 DIAGNOSIS — R279 Unspecified lack of coordination: Secondary | ICD-10-CM | POA: Diagnosis not present

## 2020-11-24 DIAGNOSIS — N3000 Acute cystitis without hematuria: Secondary | ICD-10-CM | POA: Diagnosis not present

## 2020-11-24 DIAGNOSIS — R1310 Dysphagia, unspecified: Secondary | ICD-10-CM | POA: Diagnosis not present

## 2020-11-24 DIAGNOSIS — E46 Unspecified protein-calorie malnutrition: Secondary | ICD-10-CM | POA: Diagnosis not present

## 2020-11-24 DIAGNOSIS — E162 Hypoglycemia, unspecified: Secondary | ICD-10-CM | POA: Diagnosis not present

## 2020-11-24 DIAGNOSIS — N3001 Acute cystitis with hematuria: Secondary | ICD-10-CM | POA: Diagnosis not present

## 2020-11-24 DIAGNOSIS — R633 Feeding difficulties, unspecified: Secondary | ICD-10-CM | POA: Diagnosis not present

## 2020-11-24 DIAGNOSIS — J90 Pleural effusion, not elsewhere classified: Secondary | ICD-10-CM | POA: Diagnosis not present

## 2020-11-24 DIAGNOSIS — I5030 Unspecified diastolic (congestive) heart failure: Secondary | ICD-10-CM | POA: Diagnosis not present

## 2020-11-24 DIAGNOSIS — Z452 Encounter for adjustment and management of vascular access device: Secondary | ICD-10-CM | POA: Diagnosis not present

## 2020-11-24 DIAGNOSIS — I517 Cardiomegaly: Secondary | ICD-10-CM | POA: Diagnosis not present

## 2020-11-24 DIAGNOSIS — K8301 Primary sclerosing cholangitis: Secondary | ICD-10-CM | POA: Diagnosis not present

## 2020-11-24 DIAGNOSIS — I5089 Other heart failure: Secondary | ICD-10-CM | POA: Diagnosis not present

## 2020-11-24 DIAGNOSIS — E876 Hypokalemia: Secondary | ICD-10-CM | POA: Diagnosis not present

## 2020-11-24 DIAGNOSIS — Z934 Other artificial openings of gastrointestinal tract status: Secondary | ICD-10-CM | POA: Diagnosis not present

## 2020-11-24 DIAGNOSIS — I5032 Chronic diastolic (congestive) heart failure: Secondary | ICD-10-CM | POA: Diagnosis not present

## 2020-11-24 DIAGNOSIS — E119 Type 2 diabetes mellitus without complications: Secondary | ICD-10-CM | POA: Diagnosis not present

## 2020-11-24 DIAGNOSIS — J811 Chronic pulmonary edema: Secondary | ICD-10-CM | POA: Diagnosis not present

## 2020-11-24 DIAGNOSIS — R059 Cough, unspecified: Secondary | ICD-10-CM | POA: Diagnosis not present

## 2020-11-24 DIAGNOSIS — I480 Paroxysmal atrial fibrillation: Secondary | ICD-10-CM | POA: Diagnosis not present

## 2020-11-24 DIAGNOSIS — D509 Iron deficiency anemia, unspecified: Secondary | ICD-10-CM | POA: Diagnosis not present

## 2020-11-24 DIAGNOSIS — L89159 Pressure ulcer of sacral region, unspecified stage: Secondary | ICD-10-CM | POA: Diagnosis not present

## 2020-11-24 DIAGNOSIS — E440 Moderate protein-calorie malnutrition: Secondary | ICD-10-CM | POA: Diagnosis not present

## 2020-11-24 DIAGNOSIS — K8309 Other cholangitis: Secondary | ICD-10-CM | POA: Diagnosis not present

## 2020-11-24 DIAGNOSIS — D638 Anemia in other chronic diseases classified elsewhere: Secondary | ICD-10-CM | POA: Diagnosis not present

## 2020-11-24 DIAGNOSIS — E870 Hyperosmolality and hypernatremia: Secondary | ICD-10-CM | POA: Diagnosis not present

## 2020-11-24 DIAGNOSIS — Z7401 Bed confinement status: Secondary | ICD-10-CM | POA: Diagnosis not present

## 2020-11-24 DIAGNOSIS — I509 Heart failure, unspecified: Secondary | ICD-10-CM | POA: Diagnosis not present

## 2020-11-24 DIAGNOSIS — I378 Other nonrheumatic pulmonary valve disorders: Secondary | ICD-10-CM | POA: Diagnosis not present

## 2020-11-25 DIAGNOSIS — Z452 Encounter for adjustment and management of vascular access device: Secondary | ICD-10-CM | POA: Diagnosis not present

## 2020-12-07 DIAGNOSIS — R0902 Hypoxemia: Secondary | ICD-10-CM | POA: Diagnosis not present

## 2020-12-07 DIAGNOSIS — Z7401 Bed confinement status: Secondary | ICD-10-CM | POA: Diagnosis not present

## 2020-12-07 DIAGNOSIS — R404 Transient alteration of awareness: Secondary | ICD-10-CM | POA: Diagnosis not present
